# Patient Record
Sex: Male | Born: 1975 | Race: White | Hispanic: No | Smoking: Never smoker
Health system: Southern US, Community
[De-identification: ages and names within clinical notes are randomized; demographics above are authoritative.]

## PROBLEM LIST (undated history)

## (undated) DIAGNOSIS — R51 Headache: Secondary | ICD-10-CM

## (undated) DIAGNOSIS — M1990 Unspecified osteoarthritis, unspecified site: Secondary | ICD-10-CM

## (undated) DIAGNOSIS — R519 Headache, unspecified: Secondary | ICD-10-CM

## (undated) HISTORY — PX: APPENDECTOMY: SHX54

## (undated) HISTORY — PX: KNEE ARTHROPLASTY: SHX992

---

## 2001-02-27 ENCOUNTER — Encounter (INDEPENDENT_AMBULATORY_CARE_PROVIDER_SITE_OTHER): Payer: Self-pay | Admitting: *Deleted

## 2001-02-28 ENCOUNTER — Inpatient Hospital Stay (HOSPITAL_COMMUNITY): Admission: EM | Admit: 2001-02-28 | Discharge: 2001-03-01 | Payer: Self-pay | Admitting: *Deleted

## 2006-09-29 ENCOUNTER — Emergency Department (HOSPITAL_COMMUNITY): Admission: EM | Admit: 2006-09-29 | Discharge: 2006-09-29 | Payer: Self-pay | Admitting: Family Medicine

## 2008-05-10 IMAGING — CR DG KNEE COMPLETE 4+V*L*
5 series · 5 of 5 positions shown · non-contrast
Comparison: none

CLINICAL DATA: Fall.
 LEFT KNEE ? 4 VIEW ? 7776 hours:

[view not recorded (1 of 5)]
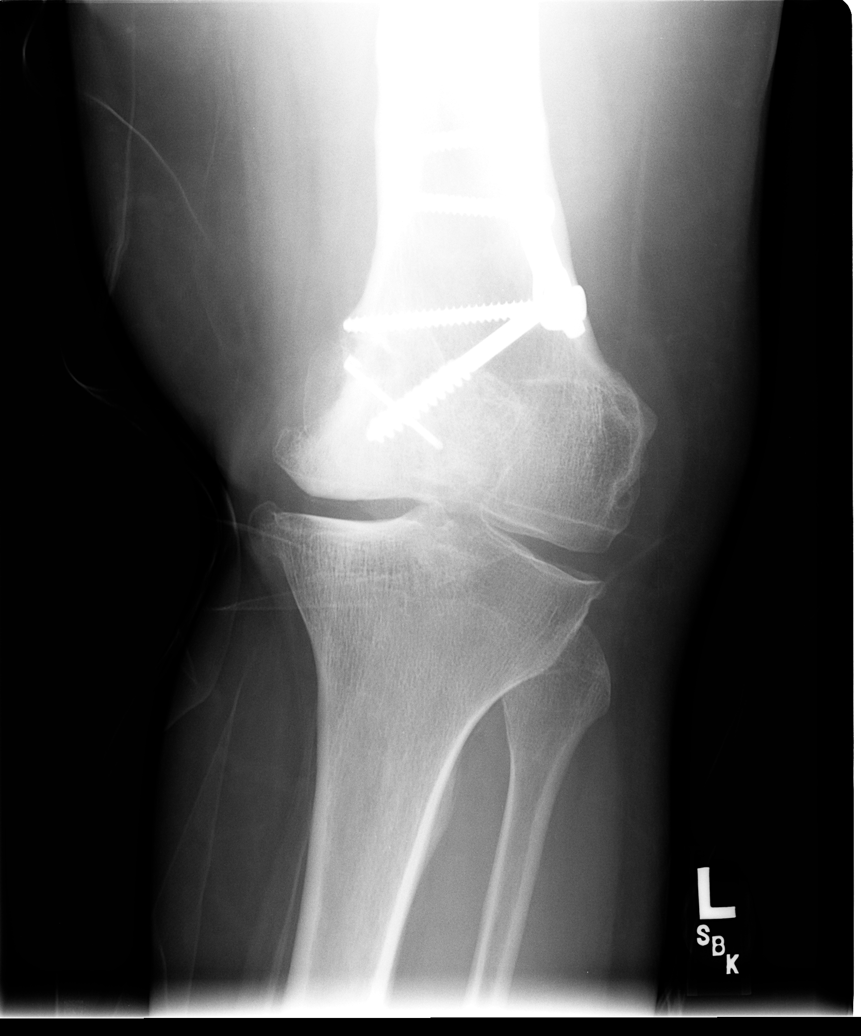

[view not recorded (2 of 5)]
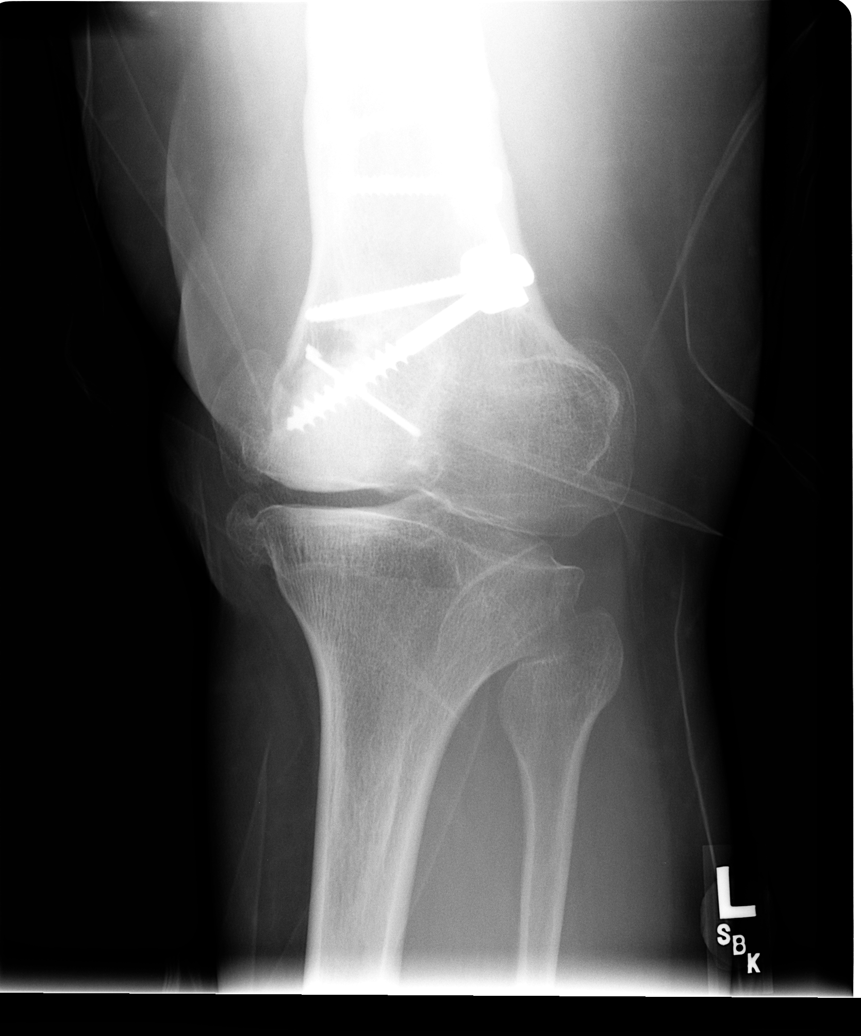

[view not recorded (3 of 5)]
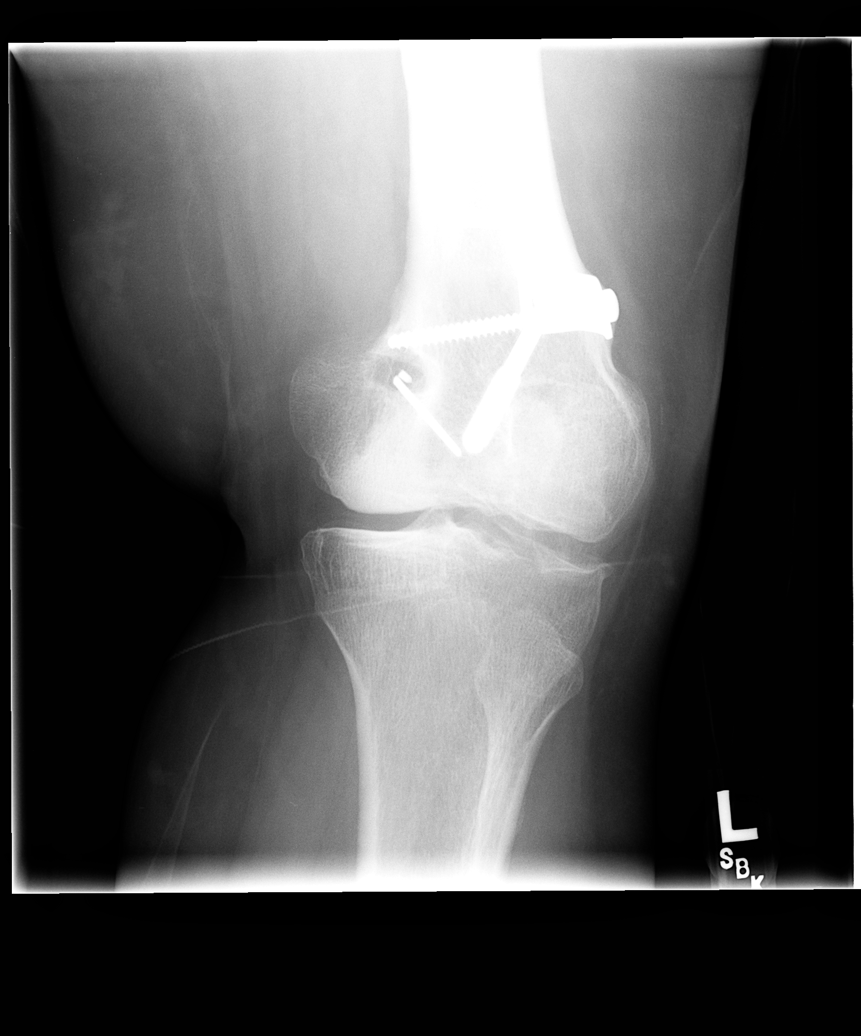

[view not recorded (4 of 5)]
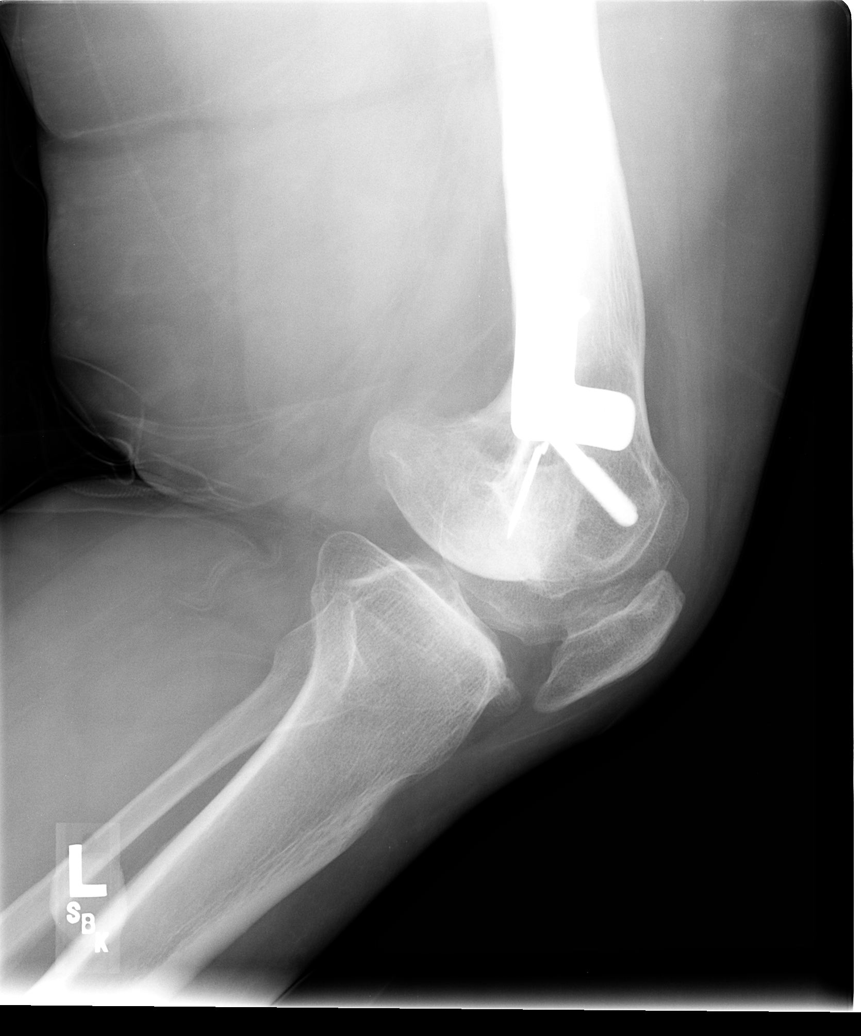

[view not recorded (5 of 5)]
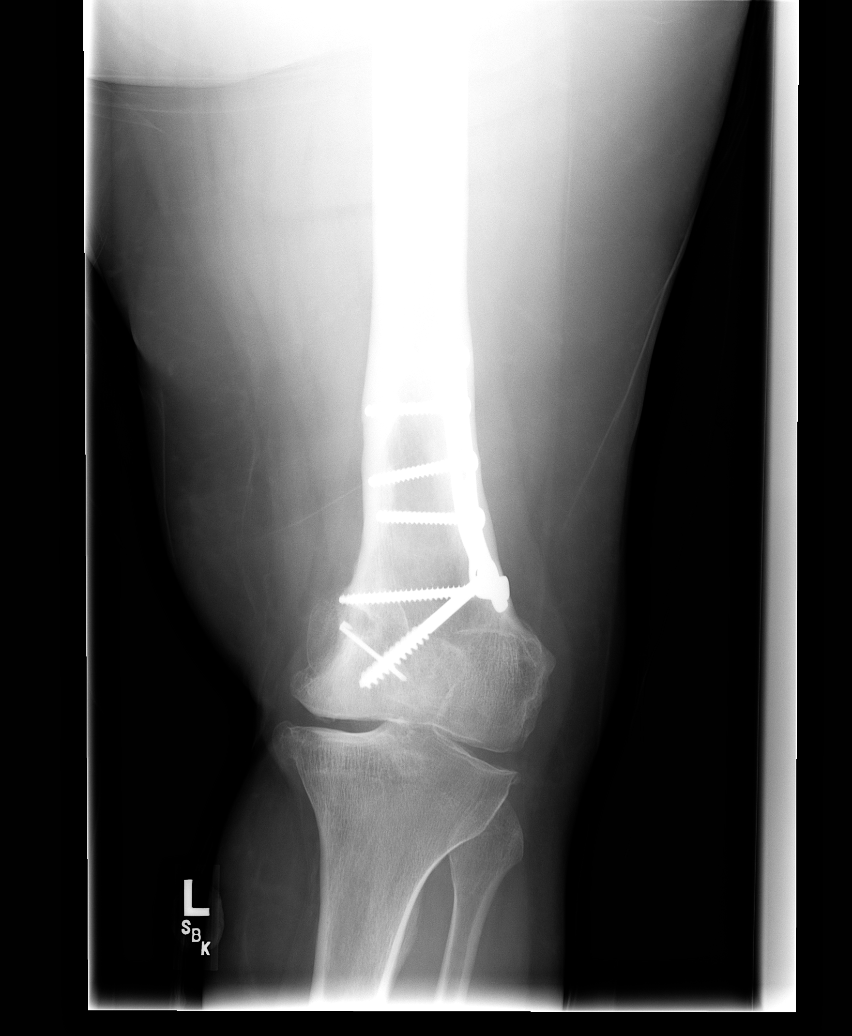

[5 of 5 positions shown; findings below may reference images not displayed]

FINDINGS: Plates and screws are seen within the distal femur.  There is deformity compatible with posttraumatic changes.  Degenerative changes are present most severely affecting the lateral compartment.  No definite acute fractures are seen.  There is a pin within the lateral femoral condyle that is fractured.  Correlate clinically.
IMPRESSION: No acute fracture.  ORIF femur.  There is a fractured pin in the lateral femoral condyle.

## 2010-12-16 NOTE — Discharge Summary (Signed)
[place]. [facility]  Patient:    [name], [name]                     MRN: [id number] Adm. Date:  [id number] Disc. Date: [id number] Attending:  Katha Cabal                           Discharge Summary  PREOPERATIVE DIAGNOSIS:  Acute appendicitis.  DISCHARGE DIAGNOSIS:  Acute appendicitis.  PROCEDURES:  Laparoscopic appendectomy.  HOSPITAL COURSE:  Philip Charles is a 35 year old male who was admitted through the emergency room and taken straight to the operating room.  He underwent a laparoscopic appendectomy.  He began feeling better and was ready for discharge on March 01, 2001.  DISCHARGE MEDICATIONS:  He will be given some ______ to take for pain.  FOLLOW-UP:  He will be followed up in the office in approximately three weeks. DD:  03/01/01 TD:  03/03/01 Job: [id number]

## 2010-12-16 NOTE — Op Note (Signed)
. [facility]  Patient:    Philip Charles, Philip Charles                     MRN: [id number] Proc. Date: 02/28/01 Adm. Date:  [id number] Attending:  Corlis Leak CC:         [facility]   Operative Report  PREOPERATIVE DIAGNOSIS:  Acute appendicitis.  POSTOPERATIVE DIAGNOSIS:  Acute appendicitis.  OPERATION: Laparoscopic appendectomy.  SURGEON:  Thornton Park. Daphine Deutscher, M.D.  ANESTHESIA:  General endotracheal.  DESCRIPTION OF PROCEDURE:  [name] was seen by me in triage tonight, having been referred over by the [facility].  This 35 year old gentleman had a 24 hour history of generalized abdominal pain localized in the right lower quadrant.  Informed consent was obtained regarding laparoscopic as well as open appendectomy when I saw him and his mother.  The patient was taken room 16, given general anesthesia.  Preoperatively, he received 3 grams of Unasyn.  The abdominal was prepped with Betadine and draped sterilely.  An umbilical incision was made longitudinally and through this pursestring suture, Hasson cannula was introduced.  The abdomen was insufflated and then a 10-11 was placed in the left lower quadrant and a 5 mm was placed in the right upper quadrant.  The appendix was easily visualized and was stuck to the ileum.  It was teased away and elevated.  It was quite thickened and suppurative.  I went through the mesentery with an application of the endoGIA.  I isolated the base.  I put some clips on a little bit of the remnant of the mesentery and then with the base isolated, I transected with a single application of the endoGIA.  The appendix was then placed in the bag. The appendiceal area was irrigated. No bleeding was seen. All the irrigant was withdrawn.  The bag was brought out through the umbilicus.  Pursestring suture was tied down.  The abdominal was deflated and the trocars were withdrawn. The left lower quadrant was  placed obliquely and these were all examined as I removed them. The patient seemed to tolerate the procedure well and was taken to the recovery room in satisfactory condition.  He will be admitted for observation.  FINAL DIAGNOSIS:  Acute appendicitis status post laparoscopic appendectomy. DD:  02/28/01 TD:  02/28/01 Job: [id number]

## 2016-06-21 DIAGNOSIS — M1732 Unilateral post-traumatic osteoarthritis, left knee: Secondary | ICD-10-CM | POA: Diagnosis not present

## 2016-09-21 DIAGNOSIS — M1732 Unilateral post-traumatic osteoarthritis, left knee: Secondary | ICD-10-CM | POA: Diagnosis not present

## 2016-10-09 DIAGNOSIS — Z0000 Encounter for general adult medical examination without abnormal findings: Secondary | ICD-10-CM | POA: Diagnosis not present

## 2016-10-09 DIAGNOSIS — E559 Vitamin D deficiency, unspecified: Secondary | ICD-10-CM | POA: Diagnosis not present

## 2016-10-16 DIAGNOSIS — Z0000 Encounter for general adult medical examination without abnormal findings: Secondary | ICD-10-CM | POA: Diagnosis not present

## 2016-10-16 DIAGNOSIS — R7989 Other specified abnormal findings of blood chemistry: Secondary | ICD-10-CM | POA: Diagnosis not present

## 2016-10-16 DIAGNOSIS — E785 Hyperlipidemia, unspecified: Secondary | ICD-10-CM | POA: Diagnosis not present

## 2016-10-18 NOTE — Progress Notes (Signed)
Preop on 3/26.  Surgery on 10/30/16.  Need orders in epic.  Thank You.

## 2016-10-19 ENCOUNTER — Ambulatory Visit: Payer: Self-pay | Admitting: Orthopedic Surgery

## 2016-10-20 ENCOUNTER — Other Ambulatory Visit (HOSPITAL_COMMUNITY): Payer: Self-pay | Admitting: Emergency Medicine

## 2016-10-20 NOTE — Patient Instructions (Addendum)
Philip Charles  10/20/2016   Your procedure is scheduled on: 10-30-16  [facility]  [facility] to 3rd floor to  Short Stay Center at 1040AM.  Call this number if you have problems the morning of surgery [phone]   Remember: ONLY 1 PERSON MAY GO WITH YOU TO SHORT STAY TO GET  READY MORNING OF YOUR SURGERY.  Do not eat food or drink liquids :After Midnight.     Take these medicines the morning of surgery with A SIP OF WATER: fexofenadine(allegra), tylenol as needed                                You may not have any metal on your body including hair pins and              piercings  Do not wear jewelry, make-up, lotions, powders or perfumes, deodorant             Do not wear nail polish.  Do not shave  48 hours prior to surgery.              Men may shave face and neck.   Do not bring valuables to the hospital. [facility] IS NOT             RESPONSIBLE   FOR VALUABLES.  Contacts, dentures or bridgework may not be worn into surgery.  Leave suitcase in the car. After surgery it may be brought to your room.               Please read over the following fact sheets you were given: _____________________________________________________________________             [facility] - Preparing for [facility] Before surgery, you can play an important role.  Because skin is not sterile, your skin needs to be as free of germs as possible.  You can reduce the number of germs on your skin by washing with CHG (chlorahexidine gluconate) soap before surgery.  CHG is an antiseptic cleaner which kills germs and bonds with the skin to continue killing germs even after washing. Please DO NOT use if you have an allergy to CHG or antibacterial soaps.  If your skin becomes reddened/irritated stop using the CHG and inform your nurse when you arrive at Short Stay. Do not shave (including legs and underarms) for at least 48 hours prior to the first CHG  shower.  You may shave your face/neck. Please follow these instructions carefully:  1.  Shower with CHG Soap the night before surgery and the  morning of Surgery.  2.  If you choose to wash your hair, wash your hair first as usual with your  normal  shampoo.  3.  After you shampoo, rinse your hair and body thoroughly to remove the  shampoo.                           4.  Use CHG as you would any other liquid soap.  You can apply chg directly  to the skin and wash                       Gently with a scrungie or clean washcloth.  5.  Apply the CHG Soap to  your body ONLY FROM THE NECK DOWN.   Do not use on face/ open                           Wound or open sores. Avoid contact with eyes, ears mouth and genitals (private parts).                       Wash face,  Genitals (private parts) with your normal soap.             6.  Wash thoroughly, paying special attention to the area where your surgery  will be performed.  7.  Thoroughly rinse your body with warm water from the neck down.  8.  DO NOT shower/wash with your normal soap after using and rinsing off  the CHG Soap.                9.  Pat yourself dry with a clean towel.            10.  Wear clean pajamas.            11.  Place clean sheets on your bed the night of your first shower and do not  sleep with pets. Day of Surgery : Do not apply any lotions/deodorants the morning of surgery.  Please wear clean clothes to the hospital/surgery center.  FAILURE TO FOLLOW THESE INSTRUCTIONS MAY RESULT IN THE CANCELLATION OF YOUR SURGERY PATIENT SIGNATURE_________________________________  NURSE SIGNATURE__________________________________  ________________________________________________________________________   Adam Phenix  An incentive spirometer is a tool that can help keep your lungs clear and active. This tool measures how well you are filling your lungs with each breath. Taking long deep breaths may help reverse or decrease the chance  of developing breathing (pulmonary) problems (especially infection) following:  A long period of time when you are unable to move or be active. BEFORE THE PROCEDURE   If the spirometer includes an indicator to show your best effort, your nurse or respiratory therapist will set it to a desired goal.  If possible, sit up straight or lean slightly forward. Try not to slouch.  Hold the incentive spirometer in an upright position. INSTRUCTIONS FOR USE  1. Sit on the edge of your bed if possible, or sit up as far as you can in bed or on a chair. 2. Hold the incentive spirometer in an upright position. 3. Breathe out normally. 4. Place the mouthpiece in your mouth and seal your lips tightly around it. 5. Breathe in slowly and as deeply as possible, raising the piston or the ball toward the top of the column. 6. Hold your breath for 3-5 seconds or for as long as possible. Allow the piston or ball to fall to the bottom of the column. 7. Remove the mouthpiece from your mouth and breathe out normally. 8. Rest for a few seconds and repeat Steps 1 through 7 at least 10 times every 1-2 hours when you are awake. Take your time and take a few normal breaths between deep breaths. 9. The spirometer may include an indicator to show your best effort. Use the indicator as a goal to work toward during each repetition. 10. After each set of 10 deep breaths, practice coughing to be sure your lungs are clear. If you have an incision (the cut made at the time of surgery), support your incision when coughing by placing a pillow or rolled up towels firmly against  it. Once you are able to get out of bed, walk around indoors and cough well. You may stop using the incentive spirometer when instructed by your caregiver.  RISKS AND COMPLICATIONS  Take your time so you do not get dizzy or light-headed.  If you are in pain, you may need to take or ask for pain medication before doing incentive spirometry. It is harder to  take a deep breath if you are having pain. AFTER USE  Rest and breathe slowly and easily.  It can be helpful to keep track of a log of your progress. Your caregiver can provide you with a simple table to help with this. If you are using the spirometer at home, follow these instructions: [place] IF:   You are having difficultly using the spirometer.  You have trouble using the spirometer as often as instructed.  Your pain medication is not giving enough relief while using the spirometer.  You develop fever of 100.5 F (38.1 C) or higher. SEEK IMMEDIATE MEDICAL CARE IF:   You cough up bloody sputum that had not been present before.  You develop fever of 102 F (38.9 C) or greater.  You develop worsening pain at or near the incision site. MAKE SURE YOU:   Understand these instructions.  Will watch your condition.  Will get help right away if you are not doing well or get worse. Document Released: 11/27/2006 Document Revised: 10/09/2011 Document Reviewed: 01/28/2007 Exit[employer] Patient Information 2014 [employer], [place].   ________________________________________________________________________  WHAT IS A BLOOD TRANSFUSION? Blood Transfusion Information  A transfusion is the replacement of blood or some of its parts. Blood is made up of multiple cells which provide different functions.  Red blood cells carry oxygen and are used for blood loss replacement.  White blood cells fight against infection.  Platelets control bleeding.  Plasma helps clot blood.  Other blood products are available for specialized needs, such as hemophilia or other clotting disorders. BEFORE THE TRANSFUSION  Who gives blood for transfusions?   Healthy volunteers who are fully evaluated to make sure their blood is safe. This is blood bank blood. Transfusion therapy is the safest it has ever been in the practice of medicine. Before blood is taken from a donor, a complete history is taken to  make sure that person has no history of diseases nor engages in risky social behavior (examples are intravenous drug use or sexual activity with multiple partners). The donor's travel history is screened to minimize risk of transmitting infections, such as malaria. The donated blood is tested for signs of infectious diseases, such as HIV and hepatitis. The blood is then tested to be sure it is compatible with you in order to minimize the chance of a transfusion reaction. If you or a relative donates blood, this is often done in anticipation of surgery and is not appropriate for emergency situations. It takes many days to process the donated blood. RISKS AND COMPLICATIONS Although transfusion therapy is very safe and saves many lives, the main dangers of transfusion include:   Getting an infectious disease.  Developing a transfusion reaction. This is an allergic reaction to something in the blood you were given. Every precaution is taken to prevent this. The decision to have a blood transfusion has been considered carefully by your caregiver before blood is given. Blood is not given unless the benefits outweigh the risks. AFTER THE TRANSFUSION  Right after receiving a blood transfusion, you will usually feel much better and more  energetic. This is especially true if your red blood cells have gotten low (anemic). The transfusion raises the level of the red blood cells which carry oxygen, and this usually causes an energy increase.  The nurse administering the transfusion will monitor you carefully for complications. HOME CARE INSTRUCTIONS  No special instructions are needed after a transfusion. You may find your energy is better. Speak with your caregiver about any limitations on activity for underlying diseases you may have. SEEK MEDICAL CARE IF:   Your condition is not improving after your transfusion.  You develop redness or irritation at the intravenous (IV) site. SEEK IMMEDIATE MEDICAL CARE  IF:  Any of the following symptoms occur over the next 12 hours:  Shaking chills.  You have a temperature by mouth above 102 F (38.9 C), not controlled by medicine.  Chest, back, or muscle pain.  People around you feel you are not acting correctly or are confused.  Shortness of breath or difficulty breathing.  Dizziness and fainting.  You get a rash or develop hives.  You have a decrease in urine output.  Your urine turns a dark color or changes to pink, red, or brown. Any of the following symptoms occur over the next 10 days:  You have a temperature by mouth above 102 F (38.9 C), not controlled by medicine.  Shortness of breath.  Weakness after normal activity.  The white part of the eye turns yellow (jaundice).  You have a decrease in the amount of urine or are urinating less often.  Your urine turns a dark color or changes to pink, red, or brown. Document Released: 07/14/2000 Document Revised: 10/09/2011 Document Reviewed: 03/02/2008 [facility] Patient Information 2014 [place], [place].  _______________________________________________________________________

## 2016-10-23 ENCOUNTER — Encounter (HOSPITAL_COMMUNITY)
Admission: RE | Admit: 2016-10-23 | Discharge: 2016-10-23 | Disposition: A | Discharge status: Home / Self Care | Payer: BLUE CROSS/BLUE SHIELD | Source: Ambulatory Visit | Attending: Orthopedic Surgery | Admitting: Orthopedic Surgery

## 2016-10-23 ENCOUNTER — Encounter (INDEPENDENT_AMBULATORY_CARE_PROVIDER_SITE_OTHER): Payer: Self-pay

## 2016-10-23 ENCOUNTER — Encounter (HOSPITAL_COMMUNITY): Payer: Self-pay

## 2016-10-23 DIAGNOSIS — M1732 Unilateral post-traumatic osteoarthritis, left knee: Secondary | ICD-10-CM | POA: Diagnosis not present

## 2016-10-23 DIAGNOSIS — T1490XS Injury, unspecified, sequela: Secondary | ICD-10-CM | POA: Diagnosis not present

## 2016-10-23 DIAGNOSIS — X58XXXS Exposure to other specified factors, sequela: Secondary | ICD-10-CM | POA: Diagnosis not present

## 2016-10-23 DIAGNOSIS — Z01818 Encounter for other preprocedural examination: Secondary | ICD-10-CM | POA: Diagnosis not present

## 2016-10-23 HISTORY — DX: Headache: R51

## 2016-10-23 HISTORY — DX: Unspecified osteoarthritis, unspecified site: M19.90

## 2016-10-23 HISTORY — DX: Headache, unspecified: R51.9

## 2016-10-23 LAB — PROTIME-INR
INR: 1.02
PROTHROMBIN TIME: 13.4 s (ref 11.4–15.2)

## 2016-10-23 LAB — SURGICAL PCR SCREEN
MRSA, PCR: NEGATIVE
STAPHYLOCOCCUS AUREUS: NEGATIVE

## 2016-10-23 LAB — APTT: APTT: 29 s (ref 24–36)

## 2016-10-23 LAB — ABO/RH: ABO/RH(D): O POS

## 2016-10-23 NOTE — Progress Notes (Addendum)
CBCdiff; CMP 10-09-16 on chart Medical clearance 10-16-16 Dr Thea SilversmithMackenzie on chart

## 2016-10-25 ENCOUNTER — Ambulatory Visit: Payer: Self-pay | Admitting: Orthopedic Surgery

## 2016-10-25 NOTE — H&P (Signed)
Philip Charles DOB: 05/01/1976 Single / Language: English / Race: White Male Date of Admission: 10/30/2016 CC:  Left knee pain History of Present Illness  The patient is a 40 year old male who comes in for a preoperative History and Physical. The patient is scheduled for a left total knee arthroplasty (and hardware removal) to be performed by Dr. Frank V. [name], MD at [facility] on 10-30-2016. The patient is a 40 year old male who presented for follow up of their knee. The patient is being followed for their left knee pain and post traumatic arthritis. Symptoms reported include: pain, aching, pain with weightbearing and difficulty ambulating. The patient feels that they are doing poorly. The following medication has been used for pain control: antiinflammatory medication (ibuprofen, prn). Philip Charles has had problems with his left lower extremity since an early age. He had a lawnmower accident as a child. He had a large open soft tissue defect medial and posteromedial aspect of the knee as well as a distal femur fracture. He was treated by Dr. Robert Fitch at [facility]. He ended up with a shorter left lower extremity and wears a large lift on his shoe now of several inches. He is in today because he has a progressively worsening pain in his left knee. He has always had stiffness and very limited motion in the knee. The pain part is getting much worse. 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 It is getting progressively more difficult to do this work. He is only 40, but really is unable to do any activities that he desires. He did not have any episodes of infection, associated with his soft tissue or bony injuries as a child. 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 He has distal femur fracture, which is well healed. He has a plate intact lateral distal femur. His alignment overall in the femur looks good, though. He  has a varus deformity of the knee with worst degeneration medial. He has got a real significant problem here. He has got a very stiff knee due to the trauma and also has that soft tissue defect medially. Fortunately, there was never history of infection after his initial injury. At this point, the only thing that predictably could improve his pain, would be a total knee arthroplasty or knee fusion. Functionally, knee fusion is probably the worst thing he could have at his age. Knee replacement, although it may not get him normal motion, should improve his pain significantly and give him a more stable platform to walk on. He would have to have the hardware removed, but it is a short plate and is distal enough, where it can be addressed through the standard total knee incision. I do believe that we would do not need to do this in two stages and it can be done successfully in a single stage. I told him our main concerns would be postoperative motion and also soft tissue healing with potential for infection. Without a prior history of infection, his risk is definitely lower. His soft tissues are mobile enough medially where we should not run into any soft tissue breakdown postoperatively. We did discuss knee replacement in detail. I told him that it is going to be somewhat more risky than a standard knee replacement, but also should provide him good benefit. We discussed all this in detail. He would like to proceed with knee replacement. We will   not be able to add any length to his leg, but by correcting his angular deformity, should functionally make the leg feel a few millimeters longer. He is ready to proceed. They have been treated conservatively in the past for the above stated problem and despite conservative measures, they continue to have progressive pain and severe functional limitations and dysfunction. They have failed non-operative management including home exercise, medications. It is felt that they  would benefit from undergoing total joint replacement. Risks and benefits of the procedure have been discussed with the patient and they elect to proceed with surgery. There are no active contraindications to surgery such as ongoing infection or rapidly progressive neurological disease.   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 Congestive Heart Failure  Mother, Paternal Grandmother. Diabetes Mellitus  Paternal Grandmother. Drug / Alcohol Addiction  Maternal Grandfather. Heart Disease  Maternal Grandfather. Hypertension  Mother, Paternal Grandmother. Osteoarthritis  Mother. Rheumatoid Arthritis  Maternal Grandmother.  Social History  Children  0 Current drinker  06/21/2016: Currently drinks beer only occasionally per week Current work status  working full time Living situation  live with parents Marital status  divorced No history of drug/alcohol rehab  Not under pain contract  Tobacco / smoke exposure  06/21/2016: yes outdoors only Tobacco use  Never smoker. 06/21/2016  Medication History  Ibuprofen (200MG Tablet, Oral as needed) Active. Multivitamin Adult (Oral) Active. Allegra (Oral) Specific strength unknown - Active.  Past Surgical History Appendectomy    Review of Systems General Present- Fever (recent cold that has resolved). Not Present- Chills, Fatigue, Memory Loss, Night Sweats, Weight Gain and Weight Loss. Skin Not Present- Eczema, Hives, Itching, Lesions and Rash. HEENT Present- Hearing Loss. Not Present- Dentures, Double Vision, Headache, Tinnitus and Visual Loss. Respiratory Present- Cough. Not Present- Allergies, Chronic Cough, Coughing up blood, Shortness of breath at rest and Shortness of breath with exertion. Cardiovascular Not Present- Chest Pain, Difficulty  Breathing Lying Down, Murmur, Palpitations, Racing/skipping heartbeats and Swelling. Gastrointestinal Not Present- Abdominal Pain, Bloody Stool, Constipation, Diarrhea, Difficulty Swallowing, Heartburn, Jaundice, Loss of appetitie, Nausea and Vomiting. Male Genitourinary Not Present- Blood in Urine, Discharge, Flank Pain, Incontinence, Painful Urination, Urgency, Urinary frequency, Urinary Retention, Urinating at Night and Weak urinary stream. Musculoskeletal Present- Back Pain and Joint Pain. Not Present- Joint Swelling, Morning Stiffness, Muscle Pain, Muscle Weakness and Spasms. Neurological Not Present- Blackout spells, Difficulty with balance, Dizziness, Paralysis, Tremor and Weakness. Psychiatric Not Present- Insomnia.  Vitals Weight: 197 lb Height: 66in Weight was reported by patient. Height was reported by patient. Body Surface Area: 1.99 m Body Mass Index: 31.8 kg/m  Pulse: 84 (Regular)  BP: 122/72 (Sitting, Right Arm, Standard)  Physical Exam General Mental Status -Alert and cooperative. Note: appears to have somewhat of a flat affect General Appearance-pleasant, Not in acute distress. Orientation-Oriented X3. Build & Nutrition-Well nourished and Well developed.  Head and Neck Head-normocephalic, atraumatic . Neck Global Assessment - supple, no bruit auscultated on the right, no bruit auscultated on the left.  Eye Vision-Wears corrective lenses. Pupil - Bilateral-Regular and Round. Motion - Bilateral-EOMI.  Chest and Lung Exam Auscultation Breath sounds - clear at anterior chest wall and clear at posterior chest wall. Adventitious sounds - No Adventitious sounds.  Cardiovascular Auscultation Rhythm - Regular rate and rhythm. Heart Sounds - S1 WNL and S2 WNL. Murmurs &   Other Heart Sounds - Auscultation of the heart reveals - No Murmurs.  Abdomen Palpation/Percussion Tenderness - Abdomen is non-tender to palpation. Rigidity (guarding) -  Abdomen is soft. Auscultation Auscultation of the abdomen reveals - Bowel sounds normal.  Male Genitourinary Note: Not done, not pertinent to present illness   Musculoskeletal Note: Well-developed male in no distress. He has a well-healed anterior scar and lateral scar. He has a significant soft tissue defect posteromedial. It is not to any open area. The skin is not terribly scarred down. It is very mobile. It is rather thin medially, but as stated, is not fixed to the underlying tissues. His range of motion is about 5 to 80. The knee is tender diffusely. There is no effusion. There is no instability noted. He is several inches short on the left, compared to the right.  RADIOGRAPHS I reviewed his radiographs from the last visit. 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 He has distal femur fracture, which is well healed. He has a plate intact lateral distal femur. His alignment overall in the femur looks good, though. He has a varus deformity of the knee with worst degeneration medial.   Assessment & Plan Post-traumatic osteoarthritis of left knee (Principal Diagnosis) (M17.32)  Note:Surgical Plans: Left Total Knee Replacement with Hardware Removal  Disposition: Home with parents helping after surgery  PCP: Dr. Brian MacKenzie (Seen the Nurse Practioner for clearance)  IV TXA  Anesthesia Issues: None  Patient was instructed on what medications to stop prior to surgery.  Signed electronically by Rashid Whitenight L Keylah Darwish, III PA-C  

## 2016-10-30 ENCOUNTER — Encounter (HOSPITAL_COMMUNITY)
Admission: RE | Disposition: A | Discharge status: Home Health | Payer: Self-pay | Source: Ambulatory Visit | Attending: Orthopedic Surgery

## 2016-10-30 ENCOUNTER — Inpatient Hospital Stay (HOSPITAL_COMMUNITY): Payer: BLUE CROSS/BLUE SHIELD | Admitting: Registered Nurse

## 2016-10-30 ENCOUNTER — Inpatient Hospital Stay (HOSPITAL_COMMUNITY)
Admission: RE | Admit: 2016-10-30 | Discharge: 2016-11-01 | DRG: 468 | Disposition: A | Discharge status: Home Health | Payer: BLUE CROSS/BLUE SHIELD | Source: Ambulatory Visit | Attending: Orthopedic Surgery | Admitting: Orthopedic Surgery

## 2016-10-30 ENCOUNTER — Encounter (HOSPITAL_COMMUNITY): Payer: Self-pay | Admitting: *Deleted

## 2016-10-30 DIAGNOSIS — M1710 Unilateral primary osteoarthritis, unspecified knee: Secondary | ICD-10-CM | POA: Diagnosis present

## 2016-10-30 DIAGNOSIS — G8918 Other acute postprocedural pain: Secondary | ICD-10-CM | POA: Diagnosis not present

## 2016-10-30 DIAGNOSIS — G8921 Chronic pain due to trauma: Secondary | ICD-10-CM | POA: Diagnosis present

## 2016-10-30 DIAGNOSIS — M179 Osteoarthritis of knee, unspecified: Secondary | ICD-10-CM | POA: Diagnosis present

## 2016-10-30 DIAGNOSIS — M1732 Unilateral post-traumatic osteoarthritis, left knee: Principal | ICD-10-CM | POA: Diagnosis present

## 2016-10-30 DIAGNOSIS — H918X9 Other specified hearing loss, unspecified ear: Secondary | ICD-10-CM | POA: Diagnosis not present

## 2016-10-30 DIAGNOSIS — T8489XA Other specified complication of internal orthopedic prosthetic devices, implants and grafts, initial encounter: Secondary | ICD-10-CM | POA: Diagnosis not present

## 2016-10-30 DIAGNOSIS — Z472 Encounter for removal of internal fixation device: Secondary | ICD-10-CM | POA: Diagnosis not present

## 2016-10-30 HISTORY — PX: HARDWARE REMOVAL: SHX979

## 2016-10-30 HISTORY — PX: TOTAL KNEE ARTHROPLASTY: SHX125

## 2016-10-30 LAB — CBC
HCT: 38.9 % — ABNORMAL LOW (ref 39.0–52.0)
Hemoglobin: 13.1 g/dL (ref 13.0–17.0)
MCH: 28.6 pg (ref 26.0–34.0)
MCHC: 33.7 g/dL (ref 30.0–36.0)
MCV: 84.9 fL (ref 78.0–100.0)
Platelets: 275 10*3/uL (ref 150–400)
RBC: 4.58 MIL/uL (ref 4.22–5.81)
RDW: 13.3 % (ref 11.5–15.5)
WBC: 10.5 10*3/uL (ref 4.0–10.5)

## 2016-10-30 LAB — COMPREHENSIVE METABOLIC PANEL
ALT: 32 U/L (ref 17–63)
AST: 26 U/L (ref 15–41)
Albumin: 3.8 g/dL (ref 3.5–5.0)
Alkaline Phosphatase: 83 U/L (ref 38–126)
Anion gap: 8 (ref 5–15)
BUN: 17 mg/dL (ref 6–20)
CO2: 25 mmol/L (ref 22–32)
Calcium: 8.8 mg/dL — ABNORMAL LOW (ref 8.9–10.3)
Chloride: 102 mmol/L (ref 101–111)
Creatinine, Ser: 0.73 mg/dL (ref 0.61–1.24)
GFR calc Af Amer: >60 mL/min (ref >60)
Glucose, Bld: 137 mg/dL — ABNORMAL HIGH (ref 65–99)
POTASSIUM: 3.9 mmol/L (ref 3.5–5.1)
SODIUM: 135 mmol/L (ref 135–145)
Total Bilirubin: 0.6 mg/dL (ref 0.3–1.2)
Total Protein: 6.9 g/dL (ref 6.5–8.1)

## 2016-10-30 LAB — TYPE AND SCREEN
ABO/RH(D): O POS
ANTIBODY SCREEN: NEGATIVE

## 2016-10-30 SURGERY — ARTHROPLASTY, KNEE, TOTAL
Anesthesia: General | Site: Knee | Laterality: Left

## 2016-10-30 MED ORDER — FLEET ENEMA 7-19 GM/118ML RE ENEM: 1.0000 | ENEMA | Freq: Once | RECTAL | Status: DC | PRN

## 2016-10-30 MED ORDER — FENTANYL CITRATE (PF) 100 MCG/2ML IJ SOLN
50.0000 ug | INTRAMUSCULAR | Status: DC | PRN
  Administered 2016-10-30: 100 ug via INTRAVENOUS

## 2016-10-30 MED ORDER — SODIUM CHLORIDE 0.9 % IV SOLN
INTRAVENOUS | Status: DC
  Administered 2016-10-30 – 2016-10-31 (×2): via INTRAVENOUS

## 2016-10-30 MED ORDER — BISACODYL 10 MG RE SUPP: 10.0000 mg | Freq: Every day | RECTAL | Status: DC | PRN

## 2016-10-30 MED ORDER — PROPOFOL 10 MG/ML IV BOLUS
INTRAVENOUS | Status: DC | PRN
  Administered 2016-10-30: 180 mg via INTRAVENOUS
  Administered 2016-10-30: 50 mg via INTRAVENOUS

## 2016-10-30 MED ORDER — SODIUM CHLORIDE 0.9 % IJ SOLN
INTRAMUSCULAR | Status: AC
  Filled 2016-10-30: qty 50

## 2016-10-30 MED ORDER — TRANEXAMIC ACID 1000 MG/10ML IV SOLN
1000.0000 mg | INTRAVENOUS | Status: AC
  Administered 2016-10-30: 1000 mg via INTRAVENOUS
  Filled 2016-10-30: qty 1100

## 2016-10-30 MED ORDER — DIPHENHYDRAMINE HCL 12.5 MG/5ML PO ELIX: 12.5000 mg | ORAL_SOLUTION | ORAL | Status: DC | PRN

## 2016-10-30 MED ORDER — METOCLOPRAMIDE HCL 5 MG PO TABS: 5.0000 mg | ORAL_TABLET | Freq: Three times a day (TID) | ORAL | Status: DC | PRN

## 2016-10-30 MED ORDER — MORPHINE SULFATE (PF) 2 MG/ML IV SOLN
1.0000 mg | INTRAVENOUS | Status: DC | PRN
  Administered 2016-10-30 (×2): 1 mg via INTRAVENOUS
  Filled 2016-10-30 (×2): qty 1

## 2016-10-30 MED ORDER — BUPIVACAINE LIPOSOME 1.3 % IJ SUSP
20.0000 mL | Freq: Once | INTRAMUSCULAR | Status: DC
  Filled 2016-10-30 (×2): qty 20

## 2016-10-30 MED ORDER — ACETAMINOPHEN 500 MG PO TABS
1000.0000 mg | ORAL_TABLET | Freq: Four times a day (QID) | ORAL | Status: AC
  Administered 2016-10-30 – 2016-10-31 (×4): 1000 mg via ORAL
  Filled 2016-10-30 (×5): qty 2

## 2016-10-30 MED ORDER — DEXAMETHASONE SODIUM PHOSPHATE 10 MG/ML IJ SOLN
INTRAMUSCULAR | Status: AC
  Filled 2016-10-30: qty 1

## 2016-10-30 MED ORDER — ONDANSETRON HCL 4 MG/2ML IJ SOLN
INTRAMUSCULAR | Status: AC
  Filled 2016-10-30: qty 2

## 2016-10-30 MED ORDER — 0.9 % SODIUM CHLORIDE (POUR BTL) OPTIME
TOPICAL | Status: DC | PRN
  Administered 2016-10-30: 1000 mL

## 2016-10-30 MED ORDER — BUPIVACAINE HCL (PF) 0.25 % IJ SOLN
INTRAMUSCULAR | Status: AC
  Filled 2016-10-30: qty 30

## 2016-10-30 MED ORDER — POLYETHYLENE GLYCOL 3350 17 G PO PACK: 17.0000 g | PACK | Freq: Every day | ORAL | Status: DC | PRN

## 2016-10-30 MED ORDER — FENTANYL CITRATE (PF) 250 MCG/5ML IJ SOLN
INTRAMUSCULAR | Status: AC
  Filled 2016-10-30: qty 5

## 2016-10-30 MED ORDER — LORATADINE 10 MG PO TABS
10.0000 mg | ORAL_TABLET | Freq: Every day | ORAL | Status: DC
  Administered 2016-10-31 – 2016-11-01 (×2): 10 mg via ORAL
  Filled 2016-10-30 (×2): qty 1

## 2016-10-30 MED ORDER — ACETAMINOPHEN 10 MG/ML IV SOLN
INTRAVENOUS | Status: AC
  Filled 2016-10-30: qty 100

## 2016-10-30 MED ORDER — MIDAZOLAM HCL 2 MG/2ML IJ SOLN
INTRAMUSCULAR | Status: AC
  Administered 2016-10-30: 2 mg
  Filled 2016-10-30: qty 2

## 2016-10-30 MED ORDER — OXYCODONE HCL 5 MG PO TABS
5.0000 mg | ORAL_TABLET | ORAL | Status: DC | PRN
  Administered 2016-10-30: 5 mg via ORAL
  Administered 2016-10-31 – 2016-11-01 (×7): 10 mg via ORAL
  Filled 2016-10-30: qty 2
  Filled 2016-10-30: qty 1
  Filled 2016-10-30 (×7): qty 2

## 2016-10-30 MED ORDER — HYDROMORPHONE HCL 1 MG/ML IJ SOLN: 0.2500 mg | INTRAMUSCULAR | Status: DC | PRN

## 2016-10-30 MED ORDER — FENTANYL CITRATE (PF) 100 MCG/2ML IJ SOLN
INTRAMUSCULAR | Status: AC
  Administered 2016-10-30: 100 ug via INTRAVENOUS
  Filled 2016-10-30: qty 2

## 2016-10-30 MED ORDER — SODIUM CHLORIDE 0.9 % IR SOLN
Status: DC | PRN
  Administered 2016-10-30: 1000 mL

## 2016-10-30 MED ORDER — ONDANSETRON HCL 4 MG/2ML IJ SOLN
INTRAMUSCULAR | Status: DC | PRN
  Administered 2016-10-30: 4 mg via INTRAVENOUS

## 2016-10-30 MED ORDER — ACETAMINOPHEN 325 MG PO TABS
650.0000 mg | ORAL_TABLET | Freq: Four times a day (QID) | ORAL | Status: DC | PRN
  Administered 2016-11-01: 650 mg via ORAL
  Filled 2016-10-30: qty 2

## 2016-10-30 MED ORDER — RIVAROXABAN 10 MG PO TABS
10.0000 mg | ORAL_TABLET | Freq: Every day | ORAL | Status: DC
  Administered 2016-10-31 – 2016-11-01 (×2): 10 mg via ORAL
  Filled 2016-10-30 (×2): qty 1

## 2016-10-30 MED ORDER — DEXAMETHASONE SODIUM PHOSPHATE 10 MG/ML IJ SOLN
10.0000 mg | Freq: Once | INTRAMUSCULAR | Status: AC
  Administered 2016-10-31: 10 mg via INTRAVENOUS
  Filled 2016-10-30: qty 1

## 2016-10-30 MED ORDER — GABAPENTIN 600 MG PO TABS: 300.0000 mg | ORAL_TABLET | Freq: Once | ORAL | Status: DC

## 2016-10-30 MED ORDER — METHOCARBAMOL 500 MG PO TABS
500.0000 mg | ORAL_TABLET | Freq: Four times a day (QID) | ORAL | Status: DC | PRN
  Administered 2016-10-31 – 2016-11-01 (×3): 500 mg via ORAL
  Filled 2016-10-30 (×4): qty 1

## 2016-10-30 MED ORDER — OXYCODONE HCL 5 MG PO TABS: 5.0000 mg | ORAL_TABLET | Freq: Once | ORAL | Status: DC | PRN

## 2016-10-30 MED ORDER — DOCUSATE SODIUM 100 MG PO CAPS
100.0000 mg | ORAL_CAPSULE | Freq: Two times a day (BID) | ORAL | Status: DC
  Administered 2016-10-30 – 2016-11-01 (×4): 100 mg via ORAL
  Filled 2016-10-30 (×5): qty 1

## 2016-10-30 MED ORDER — PHENOL 1.4 % MT LIQD
1.0000 | OROMUCOSAL | Status: DC | PRN
  Filled 2016-10-30: qty 177

## 2016-10-30 MED ORDER — BUPIVACAINE LIPOSOME 1.3 % IJ SUSP
INTRAMUSCULAR | Status: DC | PRN
Start: 2016-10-30 — End: 2016-10-30
  Administered 2016-10-30: 20 mL

## 2016-10-30 MED ORDER — MENTHOL 3 MG MT LOZG: 1.0000 | LOZENGE | OROMUCOSAL | Status: DC | PRN

## 2016-10-30 MED ORDER — ONDANSETRON HCL 4 MG/2ML IJ SOLN: 4.0000 mg | Freq: Four times a day (QID) | INTRAMUSCULAR | Status: DC | PRN

## 2016-10-30 MED ORDER — PROPOFOL 10 MG/ML IV BOLUS
INTRAVENOUS | Status: AC
  Filled 2016-10-30: qty 60

## 2016-10-30 MED ORDER — METHOCARBAMOL 1000 MG/10ML IJ SOLN
500.0000 mg | Freq: Four times a day (QID) | INTRAVENOUS | Status: DC | PRN
  Administered 2016-10-30: 500 mg via INTRAVENOUS
  Filled 2016-10-30: qty 550
  Filled 2016-10-30: qty 5

## 2016-10-30 MED ORDER — TRAMADOL HCL 50 MG PO TABS
50.0000 mg | ORAL_TABLET | Freq: Four times a day (QID) | ORAL | Status: DC | PRN
  Administered 2016-10-31: 100 mg via ORAL
  Filled 2016-10-30: qty 2

## 2016-10-30 MED ORDER — CEFAZOLIN IN D5W 1 GM/50ML IV SOLN
1.0000 g | Freq: Four times a day (QID) | INTRAVENOUS | Status: AC
  Administered 2016-10-30 – 2016-10-31 (×2): 1 g via INTRAVENOUS
  Filled 2016-10-30 (×2): qty 50

## 2016-10-30 MED ORDER — CHLORHEXIDINE GLUCONATE 4 % EX LIQD: 60.0000 mL | Freq: Once | CUTANEOUS | Status: DC

## 2016-10-30 MED ORDER — OXYCODONE HCL 5 MG/5ML PO SOLN: 5.0000 mg | Freq: Once | ORAL | Status: DC | PRN

## 2016-10-30 MED ORDER — FENTANYL CITRATE (PF) 100 MCG/2ML IJ SOLN
INTRAMUSCULAR | Status: DC | PRN
  Administered 2016-10-30 (×4): 50 ug via INTRAVENOUS
  Administered 2016-10-30: 100 ug via INTRAVENOUS
  Administered 2016-10-30 (×3): 50 ug via INTRAVENOUS

## 2016-10-30 MED ORDER — GABAPENTIN 300 MG PO CAPS
300.0000 mg | ORAL_CAPSULE | Freq: Once | ORAL | Status: AC
  Administered 2016-10-30: 300 mg via ORAL

## 2016-10-30 MED ORDER — GABAPENTIN 300 MG PO CAPS
300.0000 mg | ORAL_CAPSULE | Freq: Three times a day (TID) | ORAL | Status: DC
  Administered 2016-10-30 – 2016-11-01 (×5): 300 mg via ORAL
  Filled 2016-10-30 (×5): qty 1

## 2016-10-30 MED ORDER — BUPIVACAINE-EPINEPHRINE (PF) 0.5% -1:200000 IJ SOLN
INTRAMUSCULAR | Status: DC | PRN
  Administered 2016-10-30: 20 mL via PERINEURAL

## 2016-10-30 MED ORDER — CEFAZOLIN SODIUM-DEXTROSE 2-4 GM/100ML-% IV SOLN
INTRAVENOUS | Status: AC
  Filled 2016-10-30: qty 100

## 2016-10-30 MED ORDER — METOCLOPRAMIDE HCL 5 MG/ML IJ SOLN: 5.0000 mg | Freq: Three times a day (TID) | INTRAMUSCULAR | Status: DC | PRN

## 2016-10-30 MED ORDER — ACETAMINOPHEN 10 MG/ML IV SOLN
1000.0000 mg | Freq: Once | INTRAVENOUS | Status: AC
  Administered 2016-10-30: 1000 mg via INTRAVENOUS

## 2016-10-30 MED ORDER — ACETAMINOPHEN 650 MG RE SUPP: 650.0000 mg | Freq: Four times a day (QID) | RECTAL | Status: DC | PRN

## 2016-10-30 MED ORDER — GABAPENTIN 300 MG PO CAPS
ORAL_CAPSULE | ORAL | Status: AC
  Administered 2016-10-30: 300 mg via ORAL
  Filled 2016-10-30: qty 1

## 2016-10-30 MED ORDER — MIDAZOLAM HCL 5 MG/ML IJ SOLN
1.0000 mg | INTRAMUSCULAR | Status: DC | PRN
Start: 2016-10-30 — End: 2016-10-30

## 2016-10-30 MED ORDER — DEXAMETHASONE SODIUM PHOSPHATE 10 MG/ML IJ SOLN
10.0000 mg | Freq: Once | INTRAMUSCULAR | Status: AC
  Administered 2016-10-30: 10 mg via INTRAVENOUS

## 2016-10-30 MED ORDER — CEFAZOLIN SODIUM-DEXTROSE 2-4 GM/100ML-% IV SOLN
2.0000 g | INTRAVENOUS | Status: AC
  Administered 2016-10-30: 2 g via INTRAVENOUS

## 2016-10-30 MED ORDER — SODIUM CHLORIDE 0.9 % IJ SOLN
INTRAMUSCULAR | Status: DC | PRN
  Administered 2016-10-30: 30 mL

## 2016-10-30 MED ORDER — HYDROMORPHONE HCL 2 MG/ML IJ SOLN
INTRAMUSCULAR | Status: AC
  Filled 2016-10-30: qty 1

## 2016-10-30 MED ORDER — HYDROMORPHONE HCL 1 MG/ML IJ SOLN
INTRAMUSCULAR | Status: DC | PRN
  Administered 2016-10-30 (×2): 1 mg via INTRAVENOUS

## 2016-10-30 MED ORDER — TRANEXAMIC ACID 1000 MG/10ML IV SOLN
1000.0000 mg | Freq: Once | INTRAVENOUS | Status: AC
  Administered 2016-10-30: 1000 mg via INTRAVENOUS
  Filled 2016-10-30: qty 1100

## 2016-10-30 MED ORDER — LACTATED RINGERS IV SOLN
INTRAVENOUS | Status: DC
  Administered 2016-10-30 (×3): via INTRAVENOUS

## 2016-10-30 MED ORDER — ONDANSETRON HCL 4 MG PO TABS: 4.0000 mg | ORAL_TABLET | Freq: Four times a day (QID) | ORAL | Status: DC | PRN

## 2016-10-30 SURGICAL SUPPLY — 70 items
AUG FEM SZ4 4 STRL LF KN LT TI (Knees) ×1 IMPLANT
AUGMENT FEM SZ4 4 LT DIST PFC (Knees) ×1 IMPLANT
BAG DECANTER FOR FLEXI CONT (MISCELLANEOUS) IMPLANT
BAG SPEC THK2 15X12 ZIP CLS (MISCELLANEOUS) ×1
BAG ZIPLOCK 12X15 (MISCELLANEOUS) ×2 IMPLANT
BANDAGE ACE 6X5 VEL STRL LF (GAUZE/BANDAGES/DRESSINGS) ×2 IMPLANT
BANDAGE ESMARK 6X9 LF (GAUZE/BANDAGES/DRESSINGS) IMPLANT
BLADE 10 SAFETY STRL DISP (BLADE) ×2 IMPLANT
BLADE SAG 18X100X1.27 (BLADE) ×2 IMPLANT
BLADE SAW SGTL 11.0X1.19X90.0M (BLADE) ×2 IMPLANT
BNDG CMPR 9X6 STRL LF SNTH (GAUZE/BANDAGES/DRESSINGS)
BNDG ESMARK 6X9 LF (GAUZE/BANDAGES/DRESSINGS)
BOWL SMART MIX CTS (DISPOSABLE) ×2 IMPLANT
BUR OVAL CARBIDE 4.0 (BURR) ×2 IMPLANT
CAP KNEE TOTAL 3 SIGMA ×2 IMPLANT
CEMENT HV SMART SET (Cement) ×4 IMPLANT
CUFF TOURN SGL QUICK 18 (TOURNIQUET CUFF) IMPLANT
CUFF TOURN SGL QUICK 34 (TOURNIQUET CUFF) ×2
CUFF TRNQT CYL 34X4X40X1 (TOURNIQUET CUFF) ×1 IMPLANT
DECANTER SPIKE VIAL GLASS SM (MISCELLANEOUS) ×2 IMPLANT
DRAPE C-ARM 42X120 X-RAY (DRAPES) IMPLANT
DRAPE C-ARMOR (DRAPES) IMPLANT
DRAPE EXTREMITY T 121X128X90 (DRAPE) IMPLANT
DRAPE INCISE IOBAN 66X45 STRL (DRAPES) IMPLANT
DRAPE ORTHO SPLIT 77X108 STRL (DRAPES)
DRAPE SURG ORHT 6 SPLT 77X108 (DRAPES) IMPLANT
DRAPE U-SHAPE 47X51 STRL (DRAPES) ×2 IMPLANT
DRSG ADAPTIC 3X8 NADH LF (GAUZE/BANDAGES/DRESSINGS) ×2 IMPLANT
DRSG PAD ABDOMINAL 8X10 ST (GAUZE/BANDAGES/DRESSINGS) ×2 IMPLANT
DURAPREP 26ML APPLICATOR (WOUND CARE) ×2 IMPLANT
ELECT REM PT RETURN 15FT ADLT (MISCELLANEOUS) ×2 IMPLANT
EVACUATOR 1/8 PVC DRAIN (DRAIN) ×2 IMPLANT
GAUZE SPONGE 4X4 12PLY STRL (GAUZE/BANDAGES/DRESSINGS) ×2 IMPLANT
GLOVE BIO SURGEON STRL SZ7.5 (GLOVE) IMPLANT
GLOVE BIO SURGEON STRL SZ8 (GLOVE) ×2 IMPLANT
GLOVE BIOGEL PI IND STRL 6.5 (GLOVE) IMPLANT
GLOVE BIOGEL PI IND STRL 8 (GLOVE) ×3 IMPLANT
GLOVE BIOGEL PI INDICATOR 6.5 (GLOVE)
GLOVE BIOGEL PI INDICATOR 8 (GLOVE) ×2
GLOVE SURG ORTHO 8.0 STRL STRW (GLOVE) ×1 IMPLANT
GLOVE SURG SS PI 6.5 STRL IVOR (GLOVE) IMPLANT
GOWN STRL REUS W/TWL LRG LVL3 (GOWN DISPOSABLE) ×2 IMPLANT
GOWN STRL REUS W/TWL XL LVL3 (GOWN DISPOSABLE) ×2 IMPLANT
HANDPIECE INTERPULSE COAX TIP (DISPOSABLE) ×2
IMMOBILIZER KNEE 20 (SOFTGOODS) ×2
IMMOBILIZER KNEE 20 THIGH 36 (SOFTGOODS) ×1 IMPLANT
KIT BASIN OR (CUSTOM PROCEDURE TRAY) IMPLANT
MANIFOLD NEPTUNE II (INSTRUMENTS) ×2 IMPLANT
NS IRRIG 1000ML POUR BTL (IV SOLUTION) ×2 IMPLANT
PACK TOTAL JOINT (CUSTOM PROCEDURE TRAY) ×2 IMPLANT
PACK TOTAL KNEE CUSTOM (KITS) ×2 IMPLANT
PADDING CAST COTTON 6X4 STRL (CAST SUPPLIES) ×4 IMPLANT
POSITIONER SURGICAL ARM (MISCELLANEOUS) ×2 IMPLANT
SET HNDPC FAN SPRY TIP SCT (DISPOSABLE) ×1 IMPLANT
STAPLER VISISTAT 35W (STAPLE) ×1 IMPLANT
STRIP CLOSURE SKIN 1/2X4 (GAUZE/BANDAGES/DRESSINGS) ×4 IMPLANT
SUT MNCRL AB 4-0 PS2 18 (SUTURE) ×2 IMPLANT
SUT STRATAFIX 0 PDS 27 VIOLET (SUTURE) ×2
SUT VIC AB 0 CT1 36 (SUTURE) ×4 IMPLANT
SUT VIC AB 2-0 CT1 27 (SUTURE) ×6
SUT VIC AB 2-0 CT1 TAPERPNT 27 (SUTURE) ×3 IMPLANT
SUTURE STRATFX 0 PDS 27 VIOLET (SUTURE) ×1 IMPLANT
SYR 50ML LL SCALE MARK (SYRINGE) IMPLANT
TOWEL OR 17X26 10 PK STRL BLUE (TOWEL DISPOSABLE) IMPLANT
TRAY FOLEY W/METER SILVER 16FR (SET/KITS/TRAYS/PACK) ×2 IMPLANT
UNDERPAD 30X30 INCONTINENT (UNDERPADS AND DIAPERS) IMPLANT
WATER STERILE IRR 1000ML POUR (IV SOLUTION) ×4 IMPLANT
WATER STERILE IRR 1500ML POUR (IV SOLUTION) ×4 IMPLANT
WRAP KNEE MAXI GEL POST OP (GAUZE/BANDAGES/DRESSINGS) ×2 IMPLANT
YANKAUER SUCT BULB TIP 10FT TU (MISCELLANEOUS) ×2 IMPLANT

## 2016-10-30 NOTE — Progress Notes (Signed)
Assisted Dr. Hodierne with left, ultrasound guided, adductor canal block. Side rails up, monitors on throughout procedure. See vital signs in flow sheet. Tolerated Procedure well.  

## 2016-10-30 NOTE — Brief Op Note (Signed)
10/30/2016  3:19 PM  PATIENT:  Shanda Bumps  41 y.o. male  PRE-OPERATIVE DIAGNOSIS:  left knee post-traumatic OA  POST-OPERATIVE DIAGNOSIS:  left knee post-traumatic OA  PROCEDURE:  Procedure(s) with comments: LEFT TOTAL KNEE ARTHROPLASTY AND HARDWARE REMOVAL (Left) - with abductor block HARDWARE REMOVAL (Left)  SURGEON:  Surgeon(s) and Role:    * Ollen Gross, MD - Primary  PHYSICIAN ASSISTANT:   ASSISTANTS: Leilani Able, PA-C   ANESTHESIA:   Adductor canal block and general  EBL:  Total I/O In: 2000 [I.V.:2000] Out: 365 [Urine:165; Blood:200]  BLOOD ADMINISTERED:none  DRAINS: (Medium) Hemovact drain(s) in the left knee with  Suction Open   LOCAL MEDICATIONS USED:  OTHER Exparel  COUNTS:  YES  TOURNIQUET:   Total Tourniquet Time Documented: Thigh (Left) - 114 minutes Total: Thigh (Left) - 114 minutes   DICTATION: .Other Dictation: Dictation Number [id number]  PLAN OF CARE: Admit to inpatient   PATIENT DISPOSITION:  PACU - hemodynamically stable.

## 2016-10-30 NOTE — Anesthesia Preprocedure Evaluation (Signed)
Anesthesia Evaluation  Patient identified by MRN, date of birth, ID band Patient awake    Reviewed: Allergy & Precautions, H&P , NPO status , Patient's Chart, lab work & pertinent test results  Airway Mallampati: II   Neck ROM: full    Dental   Pulmonary neg pulmonary ROS,    breath sounds clear to auscultation       Cardiovascular negative cardio ROS   Rhythm:regular Rate:Normal     Neuro/Psych  Headaches,    GI/Hepatic   Endo/Other    Renal/GU      Musculoskeletal  (+) Arthritis , Osteoarthritis,    Abdominal   Peds  Hematology   Anesthesia Other Findings   Reproductive/Obstetrics                             Anesthesia Physical Anesthesia Plan  ASA: II  Anesthesia Plan: Spinal   Post-op Pain Management:  Regional for Post-op pain   Induction: Intravenous  Airway Management Planned: Simple Face Mask  Additional Equipment:   Intra-op Plan:   Post-operative Plan:   Informed Consent: I have reviewed the patients History and Physical, chart, labs and discussed the procedure including the risks, benefits and alternatives for the proposed anesthesia with the patient or authorized representative who has indicated his/her understanding and acceptance.     Plan Discussed with: CRNA, Anesthesiologist and Surgeon  Anesthesia Plan Comments:         Anesthesia Quick Evaluation

## 2016-10-30 NOTE — Interval H&P Note (Signed)
History and Physical Interval Note:  10/30/2016 11:50 AM  Philip Charles  has presented today for surgery, with the diagnosis of left knee post-traumatic OA  The various methods of treatment have been discussed with the patient and family. After consideration of risks, benefits and other options for treatment, the patient has consented to  Procedure(s): LEFT TOTAL KNEE ARTHROPLASTY AND HARDWARE REMOVAL (Left) HARDWARE REMOVAL (Left) as a surgical intervention .  The patient's history has been reviewed, patient examined, no change in status, stable for surgery.  I have reviewed the patient's chart and labs.  Questions were answered to the patient's satisfaction.     Loanne Drilling

## 2016-10-30 NOTE — Anesthesia Procedure Notes (Signed)
Procedure Name: LMA Insertion Date/Time: 10/30/2016 12:47 PM Performed by: Anastasio Champion E Pre-anesthesia Checklist: Patient identified, Emergency Drugs available, Suction available and Patient being monitored Patient Re-evaluated:Patient Re-evaluated prior to inductionOxygen Delivery Method: Circle system utilized Preoxygenation: Pre-oxygenation with 100% oxygen Intubation Type: IV induction Ventilation: Mask ventilation without difficulty LMA: LMA with gastric port inserted LMA Size: 5.0 Tube type: Oral Number of attempts: 1 Airway Equipment and Method: Oral airway Placement Confirmation: positive ETCO2 Tube secured with: Tape Dental Injury: Teeth and Oropharynx as per pre-operative assessment

## 2016-10-30 NOTE — Anesthesia Procedure Notes (Signed)
Anesthesia Regional Block: Adductor canal block   Pre-Anesthetic Checklist: ,, timeout performed, Correct Patient, Correct Site, Correct Laterality, Correct Procedure, Correct Position, site marked, Risks and benefits discussed,  Surgical consent,  Pre-op evaluation,  At surgeon's request and post-op pain management  Laterality: Left  Prep: chloraprep       Needles:  Injection technique: Single-shot  Needle Type: Echogenic Needle     Needle Length: 9cm  Needle Gauge: 21     Additional Needles:   Procedures: ultrasound guided,,,,,,,,  Narrative:  Start time: 10/30/2016 12:30 PM End time: 10/30/2016 12:39 PM Injection made incrementally with aspirations every 5 mL.  Performed by: Personally  Anesthesiologist: Rochanda Harpham  Additional Notes: Pt tolerated the procedure well.

## 2016-10-30 NOTE — Transfer of Care (Signed)
Immediate Anesthesia Transfer of Care Note  Patient: Philip Charles  Procedure(s) Performed: Procedure(s) with comments: LEFT TOTAL KNEE ARTHROPLASTY AND HARDWARE REMOVAL (Left) - with abductor block HARDWARE REMOVAL (Left)  Patient Location: PACU  Anesthesia Type:General  Level of Consciousness: awake, alert , oriented and patient cooperative  Airway & Oxygen Therapy: Patient Spontanous Breathing and Patient connected to face mask oxygen  Post-op Assessment: Report given to RN and Post -op Vital signs reviewed and stable  Post vital signs: stable  Last Vitals:  Vitals:   10/30/16 1232 10/30/16 1234  BP: 125/83   Pulse: 64 67  Resp: 18 15  Temp:      Last Pain:  Vitals:   10/30/16 1146  TempSrc:   PainSc: 5          Complications: No apparent anesthesia complications

## 2016-10-30 NOTE — Anesthesia Postprocedure Evaluation (Addendum)
Anesthesia Post Note  Patient: Philip Charles  Procedure(s) Performed: Procedure(s) (LRB): LEFT TOTAL KNEE ARTHROPLASTY AND HARDWARE REMOVAL (Left) HARDWARE REMOVAL (Left)  Patient location during evaluation: PACU Anesthesia Type: General Level of consciousness: awake and alert and patient cooperative Pain management: pain level controlled Vital Signs Assessment: post-procedure vital signs reviewed and stable Respiratory status: spontaneous breathing and respiratory function stable Cardiovascular status: stable Anesthetic complications: no       Last Vitals:  Vitals:   10/30/16 1615 10/30/16 1630  BP: (!) 157/102 (!) 154/95  Pulse: 93 92  Resp: 11 10  Temp:      Last Pain:  Vitals:   10/30/16 1615  TempSrc:   PainSc: 0-No pain                 Zalayah Pizzuto S

## 2016-10-30 NOTE — H&P (View-Only) (Signed)
Philip Charles DOB: 1975/12/02 Single / Language: [place] / Race: White Male Date of Admission: 10/30/2016 CC:  Left knee pain History of Present Illness  The patient is a 41 year old male who comes in for a preoperative History and Physical. The patient is scheduled for a left total knee arthroplasty (and hardware removal) to be performed by Dr. Gus Charles. [name], MD at [facility] on 10-30-2016. The patient is a 41 year old male who presented for follow up of their knee. The patient is being followed for their left knee pain and post traumatic arthritis. Symptoms reported include: pain, aching, pain with weightbearing and difficulty ambulating. The patient feels that they are doing poorly. The following medication has been used for pain control: antiinflammatory medication (ibuprofen, prn). Philip Charles has had problems with his left lower extremity since an early age. He had a lawnmower accident as a child. He had a large open soft tissue defect medial and posteromedial aspect of the knee as well as a distal femur fracture. He was treated by Dr. Jimmey Charles at [facility]. He ended up with a shorter left lower extremity and wears a large lift on his shoe now of several inches. He is in today because he has a progressively worsening pain in his left knee. He has always had stiffness and very limited motion in the knee. The pain part is getting much worse. 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 It is getting progressively more difficult to do this work. He is only 40, but really is unable to do any activities that he desires. He did not have any episodes of infection, associated with his soft tissue or bony injuries as a child. 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 He has distal femur fracture, which is well healed. He has a plate intact lateral distal femur. His alignment overall in the femur looks good, though. He  has a varus deformity of the knee with worst degeneration medial. He has got a real significant problem here. He has got a very stiff knee due to the trauma and also has that soft tissue defect medially. Fortunately, there was never history of infection after his initial injury. At this point, the only thing that predictably could improve his pain, would be a total knee arthroplasty or knee fusion. Functionally, knee fusion is probably the worst thing he could have at his age. Knee replacement, although it may not get him normal motion, should improve his pain significantly and give him a more stable platform to walk on. He would have to have the hardware removed, but it is a short plate and is distal enough, where it can be addressed through the standard total knee incision. I do believe that we would do not need to do this in two stages and it can be done successfully in a single stage. I told him our main concerns would be postoperative motion and also soft tissue healing with potential for infection. Without a prior history of infection, his risk is definitely lower. His soft tissues are mobile enough medially where we should not run into any soft tissue breakdown postoperatively. We did discuss knee replacement in detail. I told him that it is going to be somewhat more risky than a standard knee replacement, but also should provide him good benefit. We discussed all this in detail. He would like to proceed with knee replacement. We will  not be able to add any length to his leg, but by correcting his angular deformity, should functionally make the leg feel a few millimeters longer. He is ready to proceed. They have been treated conservatively in the past for the above stated problem and despite conservative measures, they continue to have progressive pain and severe functional limitations and dysfunction. They have failed non-operative management including home exercise, medications. It is felt that they  would benefit from undergoing total joint replacement. Risks and benefits of the procedure have been discussed with the patient and they elect to proceed with surgery. There are no active contraindications to surgery such as ongoing infection or rapidly progressive neurological disease.   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 Congestive Heart Failure  Mother, Paternal Grandmother. Diabetes Mellitus  Paternal Grandmother. Drug / Alcohol Addiction  Maternal Grandfather. Heart Disease  Maternal Grandfather. Hypertension  Mother, Paternal Grandmother. Osteoarthritis  Mother. Rheumatoid Arthritis  Maternal Grandmother.  Social History  Children  0 Current drinker  06/21/2016: Currently drinks beer only occasionally per week Current work status  working full time Living situation  live with parents Marital status  divorced No history of drug/alcohol rehab  Not under pain contract  Tobacco / smoke exposure  06/21/2016: yes outdoors only Tobacco use  Never smoker. 06/21/2016  Medication History  Ibuprofen (  Tablet, Oral as needed) Active. Multivitamin Adult (Oral) Active. Allegra (Oral) Specific strength unknown - Active.  Past Surgical History Appendectomy    Review of Systems General Present- Fever (recent cold that has resolved). Not Present- Chills, Fatigue, Memory Loss, Night Sweats, Weight Gain and Weight Loss. Skin Not Present- Eczema, Hives, Itching, Lesions and Rash. HEENT Present- Hearing Loss. Not Present- Dentures, Double Vision, Headache, Tinnitus and Visual Loss. Respiratory Present- Cough. Not Present- Allergies, Chronic Cough, Coughing up blood, Shortness of breath at rest and Shortness of breath with exertion. Cardiovascular Not Present- Chest Pain, Difficulty  Breathing Lying Down, Murmur, Palpitations, Racing/skipping heartbeats and Swelling. Gastrointestinal Not Present- Abdominal Pain, Bloody Stool, Constipation, Diarrhea, Difficulty Swallowing, Heartburn, Jaundice, Loss of appetitie, Nausea and Vomiting. Male Genitourinary Not Present- Blood in Urine, Discharge, Flank Pain, Incontinence, Painful Urination, Urgency, Urinary frequency, Urinary Retention, Urinating at Night and Weak urinary stream. Musculoskeletal Present- Back Pain and Joint Pain. Not Present- Joint Swelling, Morning Stiffness, Muscle Pain, Muscle Weakness and Spasms. Neurological Not Present- Blackout spells, Difficulty with balance, Dizziness, Paralysis, Tremor and Weakness. Psychiatric Not Present- Insomnia.  Vitals Weight: 197 lb Height: 66in Weight was reported by patient. Height was reported by patient. Body Surface Area: 1.99 m Body Mass Index: 31.8 kg/m  Pulse: 84 (Regular)  BP: 122/72 (Sitting, Right Arm, Standard)  Physical Exam General Mental Status -Alert and cooperative. Note: appears to have somewhat of a flat affect General Appearance-pleasant, Not in acute distress. Orientation-Oriented X3. Build & Nutrition-Well nourished and Well developed.  Head and Neck Head-normocephalic, atraumatic . Neck Global Assessment - supple, no bruit auscultated on the right, no bruit auscultated on the left.  Eye Vision-Wears corrective lenses. Pupil - Bilateral-Regular and Round. Motion - Bilateral-EOMI.  Chest and Lung Exam Auscultation Breath sounds - clear at anterior chest wall and clear at posterior chest wall. Adventitious sounds - No Adventitious sounds.  Cardiovascular Auscultation Rhythm - Regular rate and rhythm. Heart Sounds - S1 WNL and S2 WNL. Murmurs &  Other Heart Sounds - Auscultation of the heart reveals - No Murmurs.  Abdomen Palpation/Percussion Tenderness - Abdomen is non-tender to palpation. Rigidity (guarding) -  Abdomen is soft. Auscultation Auscultation of the abdomen reveals - Bowel sounds normal.  Male Genitourinary Note: Not done, not pertinent to present illness   Musculoskeletal Note: Well-developed male in no distress. He has a well-healed anterior scar and lateral scar. He has a significant soft tissue defect posteromedial. It is not to any open area. The skin is not terribly scarred down. It is very mobile. It is rather thin medially, but as stated, is not fixed to the underlying tissues. His range of motion is about 5 to 80. The knee is tender diffusely. There is no effusion. There is no instability noted. He is several inches short on the left, compared to the right.  RADIOGRAPHS I reviewed his radiographs from the last visit. 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 He has distal femur fracture, which is well healed. He has a plate intact lateral distal femur. His alignment overall in the femur looks good, though. He has a varus deformity of the knee with worst degeneration medial.   Assessment & Plan Post-traumatic osteoarthritis of left knee (Principal Diagnosis) (M17.32)  Note:Surgical Plans: Left Total Knee Replacement with Hardware Removal  Disposition: Home with parents helping after surgery  PCP: Dr. Thayer Headings (Seen the Nurse Practioner for clearance)  IV TXA  Anesthesia Issues: None  Patient was instructed on what medications to stop prior to surgery.  Signed electronically by Lauraine Rinne, III PA-C

## 2016-10-31 ENCOUNTER — Encounter (HOSPITAL_COMMUNITY): Payer: Self-pay | Admitting: Orthopedic Surgery

## 2016-10-31 LAB — BASIC METABOLIC PANEL
ANION GAP: 7 (ref 5–15)
BUN: 10 mg/dL (ref 6–20)
CALCIUM: 8.5 mg/dL — AB (ref 8.9–10.3)
CO2: 25 mmol/L (ref 22–32)
Chloride: 101 mmol/L (ref 101–111)
Creatinine, Ser: 0.6 mg/dL — ABNORMAL LOW (ref 0.61–1.24)
GFR calc non Af Amer: >60 mL/min (ref >60)
Glucose, Bld: 133 mg/dL — ABNORMAL HIGH (ref 65–99)
Potassium: 3.5 mmol/L (ref 3.5–5.1)
Sodium: 133 mmol/L — ABNORMAL LOW (ref 135–145)

## 2016-10-31 LAB — CBC
HCT: 36.1 % — ABNORMAL LOW (ref 39.0–52.0)
HEMOGLOBIN: 12.2 g/dL — AB (ref 13.0–17.0)
MCH: 29.4 pg (ref 26.0–34.0)
MCHC: 33.8 g/dL (ref 30.0–36.0)
MCV: 87 fL (ref 78.0–100.0)
Platelets: 256 10*3/uL (ref 150–400)
RBC: 4.15 MIL/uL — AB (ref 4.22–5.81)
RDW: 13.5 % (ref 11.5–15.5)
WBC: 9.6 10*3/uL (ref 4.0–10.5)

## 2016-10-31 MED ORDER — GABAPENTIN 300 MG PO CAPS: 300.0000 mg | ORAL_CAPSULE | Freq: Two times a day (BID) | ORAL | 0 refills | Status: AC

## 2016-10-31 MED ORDER — METHOCARBAMOL 500 MG PO TABS: 500.0000 mg | ORAL_TABLET | Freq: Four times a day (QID) | ORAL | 0 refills | Status: AC | PRN

## 2016-10-31 MED ORDER — RIVAROXABAN 10 MG PO TABS: 10.0000 mg | ORAL_TABLET | Freq: Every day | ORAL | 0 refills | Status: AC

## 2016-10-31 MED ORDER — TRAMADOL HCL 50 MG PO TABS: 50.0000 mg | ORAL_TABLET | Freq: Four times a day (QID) | ORAL | 0 refills | Status: AC | PRN

## 2016-10-31 MED ORDER — OXYCODONE HCL 5 MG PO TABS: 5.0000 mg | ORAL_TABLET | ORAL | 0 refills | Status: AC | PRN

## 2016-10-31 NOTE — Discharge Instructions (Addendum)
° °Dr. Summers Buendia °Total Joint Specialist °[facility] °[address] °[place], [place] [address] °[phone] ° °TOTAL KNEE REPLACEMENT POSTOPERATIVE DIRECTIONS ° °Knee Rehabilitation, Guidelines Following Surgery  °Results after knee surgery are often greatly improved when you follow the exercise, range of motion and muscle strengthening exercises prescribed by your doctor. Safety measures are also important to protect the knee from further injury. Any time any of these exercises cause you to have increased pain or swelling in your knee joint, decrease the amount until you are comfortable again and slowly increase them. If you have problems or questions, call your caregiver or physical therapist for advice.  ° °HOME CARE INSTRUCTIONS  °Remove items at home which could result in a fall. This includes throw rugs or furniture in walking pathways.  °· ICE to the affected knee every three hours for 30 minutes at a time and then as needed for pain and swelling.  Continue to use ice on the knee for pain and swelling from surgery. You may notice swelling that will progress down to the foot and ankle.  This is normal after surgery.  Elevate the leg when you are not up walking on it.   °· Continue to use the breathing machine which will help keep your temperature down.  It is common for your temperature to cycle up and down following surgery, especially at night when you are not up moving around and exerting yourself.  The breathing machine keeps your lungs expanded and your temperature down. °· Do not place pillow under knee, focus on keeping the knee straight while resting ° °DIET °You may resume your previous home diet once your are discharged from the hospital. ° °DRESSING / WOUND CARE / SHOWERING °You may start showering once you are discharged home but do not submerge the incision under water. Just pat the incision dry and apply a dry gauze dressing on daily. °Change the surgical dressing  daily and reapply a dry dressing each time. ° °ACTIVITY °Walk with your walker as instructed. °Use walker as long as suggested by your caregivers. °Avoid periods of inactivity such as sitting longer than an hour when not asleep. This helps prevent blood clots.  °You may resume a sexual relationship in one month or when given the OK by your doctor.  °You may return to work once you are cleared by your doctor.  °Do not drive a car for 6 weeks or until released by you surgeon.  °Do not drive while taking narcotics. ° °WEIGHT BEARING °Weight bearing as tolerated with assist device (walker, cane, etc) as directed, use it as long as suggested by your surgeon or therapist, typically at least 4-6 weeks. ° °POSTOPERATIVE CONSTIPATION PROTOCOL °Constipation - defined medically as fewer than three stools per week and severe constipation as less than one stool per week. ° °One of the most common issues patients have following surgery is constipation.  Even if you have a regular bowel pattern at home, your normal regimen is likely to be disrupted due to multiple reasons following surgery.  Combination of anesthesia, postoperative narcotics, change in appetite and fluid intake all can affect your bowels.  In order to avoid complications following surgery, here are some recommendations in order to help you during your recovery period. ° °Colace (docusate) - Pick up an over-the-counter form of Colace or another stool softener and take twice a day as long as you are requiring postoperative pain medications.  Take with a full glass of water   daily.  If you experience loose stools or diarrhea, hold the colace until you stool forms back up.  If your symptoms do not get better within 1 week or if they get worse, check with your doctor. ° °Dulcolax (bisacodyl) - Pick up over-the-counter and take as directed by the product packaging as needed to assist with the movement of your bowels.  Take with a full glass of water.  Use this product as  needed if not relieved by Colace only.  ° °MiraLax (polyethylene glycol) - Pick up over-the-counter to have on hand.  MiraLax is a solution that will increase the amount of water in your bowels to assist with bowel movements.  Take as directed and can mix with a glass of water, juice, soda, coffee, or tea.  Take if you go more than two days without a movement. °Do not use MiraLax more than once per day. Call your doctor if you are still constipated or irregular after using this medication for 7 days in a row. ° °If you continue to have problems with postoperative constipation, please contact the office for further assistance and recommendations.  If you experience "the worst abdominal pain ever" or develop nausea or vomiting, please contact the office immediatly for further recommendations for treatment. ° °ITCHING ° If you experience itching with your medications, try taking only a single pain pill, or even half a pain pill at a time.  You can also use Benadryl over the counter for itching or also to help with sleep.  ° °TED HOSE STOCKINGS °Wear the elastic stockings on both legs for three weeks following surgery during the day but you may remove then at night for sleeping. ° °MEDICATIONS °See your medication summary on the “After Visit Summary” that the nursing staff will review with you prior to discharge.  You may have some home medications which will be placed on hold until you complete the course of blood thinner medication.  It is important for you to complete the blood thinner medication as prescribed by your surgeon.  Continue your approved medications as instructed at time of discharge. ° °PRECAUTIONS °If you experience chest pain or shortness of breath - call 911 immediately for transfer to the hospital emergency department.  °If you develop a fever greater that 101 F, purulent drainage from wound, increased redness or drainage from wound, foul odor from the wound/dressing, or calf pain - CONTACT YOUR  SURGEON.   °                                                °FOLLOW-UP APPOINTMENTS °Make sure you keep all of your appointments after your operation with your surgeon and caregivers. You should call the office at the above phone number and make an appointment for approximately two weeks after the date of your surgery or on the date instructed by your surgeon outlined in the "After Visit Summary". ° ° °RANGE OF MOTION AND STRENGTHENING EXERCISES  °Rehabilitation of the knee is important following a knee injury or an operation. After just a few days of immobilization, the muscles of the thigh which control the knee become weakened and shrink (atrophy). Knee exercises are designed to build up the tone and strength of the thigh muscles and to improve knee motion. Often times heat used for twenty to thirty minutes before working out will loosen   up your tissues and help with improving the range of motion but do not use heat for the first two weeks following surgery. These exercises can be done on a training (exercise) mat, on the floor, on a table or on a bed. Use what ever works the best and is most comfortable for you Knee exercises include:  °Leg Lifts - While your knee is still immobilized in a splint or cast, you can do straight leg raises. Lift the leg to 60 degrees, hold for 3 sec, and slowly lower the leg. Repeat 10-20 times 2-3 times daily. Perform this exercise against resistance later as your knee gets better.  °Quad and Hamstring Sets - Tighten up the muscle on the front of the thigh (Quad) and hold for 5-10 sec. Repeat this 10-20 times hourly. Hamstring sets are done by pushing the foot backward against an object and holding for 5-10 sec. Repeat as with quad sets.  °· Leg Slides: Lying on your back, slowly slide your foot toward your buttocks, bending your knee up off the floor (only go as far as is comfortable). Then slowly slide your foot back down until your leg is flat on the floor again. °· Angel Wings:  Lying on your back spread your legs to the side as far apart as you can without causing discomfort.  °A rehabilitation program following serious knee injuries can speed recovery and prevent re-injury in the future due to weakened muscles. Contact your doctor or a physical therapist for more information on knee rehabilitation.  ° °IF YOU ARE TRANSFERRED TO A SKILLED REHAB FACILITY °If the patient is transferred to a skilled rehab facility following release from the hospital, a list of the current medications will be sent to the facility for the patient to continue.  When discharged from the skilled rehab facility, please have the facility set up the patient's Home Health Physical Therapy prior to being released. Also, the skilled facility will be responsible for providing the patient with their medications at time of release from the facility to include their pain medication, the muscle relaxants, and their blood thinner medication. If the patient is still at the rehab facility at time of the two week follow up appointment, the skilled rehab facility will also need to assist the patient in arranging follow up appointment in our office and any transportation needs. ° °MAKE SURE YOU:  °Understand these instructions.  °Get help right away if you are not doing well or get worse.  ° ° °Pick up stool softner and laxative for home use following surgery while on pain medications. °Do not submerge incision under water. °Please use good hand washing techniques while changing dressing each day. °May shower starting three days after surgery. °Please use a clean towel to pat the incision dry following showers. °Continue to use ice for pain and swelling after surgery. °Do not use any lotions or creams on the incision until instructed by your surgeon. ° ° °Information on my medicine - XARELTO® (Rivaroxaban) ° °This medication education was reviewed with me or my healthcare representative as part of my discharge preparation.  The  pharmacist that spoke with me during my hospital stay was:  Bell, Michelle T, RPH ° °Why was Xarelto® prescribed for you? °Xarelto® was prescribed for you to reduce the risk of blood clots forming after orthopedic surgery. The medical term for these abnormal blood clots is venous thromboembolism (VTE). ° °What do you need to know about xarelto® ? °Take your Xarelto® ONCE DAILY   at the same time every day. °You may take it either with or without food. ° °If you have difficulty swallowing the tablet whole, you may crush it and mix in applesauce just prior to taking your dose. ° °Take Xarelto® exactly as prescribed by your doctor and DO NOT stop taking Xarelto® without talking to the doctor who prescribed the medication.  Stopping without other VTE prevention medication to take the place of Xarelto® may increase your risk of developing a clot. ° °After discharge, you should have regular check-up appointments with your healthcare provider that is prescribing your Xarelto®.   ° °What do you do if you miss a dose? °If you miss a dose, take it as soon as you remember on the same day then continue your regularly scheduled once daily regimen the next day. Do not take two doses of Xarelto® on the same day.  ° °Important Safety Information °A possible side effect of Xarelto® is bleeding. You should call your healthcare provider right away if you experience any of the following: °? Bleeding from an injury or your nose that does not stop. °? Unusual colored urine (red or dark brown) or unusual colored stools (red or black). °? Unusual bruising for unknown reasons. °? A serious fall or if you hit your head (even if there is no bleeding). ° °Some medicines may interact with Xarelto® and might increase your risk of bleeding while on Xarelto®. To help avoid this, consult your healthcare provider or pharmacist prior to using any new prescription or non-prescription medications, including herbals, vitamins, non-steroidal  anti-inflammatory drugs (NSAIDs) and supplements. ° °This website has more information on Xarelto®: www.xarelto.com. ° ° °

## 2016-10-31 NOTE — Addendum Note (Signed)
Addendum  created 10/31/16 1001 by Elyn Peers, CRNA   Charge Capture section accepted

## 2016-10-31 NOTE — Progress Notes (Signed)
10/31/16 1516  PT Visit Information  Last PT Received On 10/31/16  Assistance Needed +1  History of Present Illness Pt is a 41 year old male s/p L TKA with hardware removal. Per notes, "Pt had a lawnmower accident as a child. He had a large open soft tissue defect medial and posteromedial aspect of the knee as well as a distal femur fracture. He was treated by Dr. Jimmey Ralph at [facility]. He ended up with a shorter left lower extremity and wears a large lift on his shoe now of several inches."  Subjective Data  Subjective Pt assisted with performing TKA exercises in the recliner and then ambulated in the hallway.  Pt tolerated improved distance.  Pt handed off to OT once back to room.  Precautions  Precautions Fall;Knee  Precaution Comments limit flexion to 30 degrees, also hx of LLD and wears special shoes (L shoe with more of a lift which may need adjusting s/p L TKA)  Required Braces or Orthoses Knee Immobilizer - Left  Restrictions  LLE Weight Bearing WBAT  Pain Assessment  Pain Assessment 0-10  Pain Score 4  Pain Location L knee  Pain Descriptors / Indicators Aching;Sore  Pain Intervention(s) Limited activity within patient's tolerance;Monitored during session  Cognition  Arousal/Alertness Awake/alert  Behavior During Therapy WFL for tasks assessed/performed  Overall Cognitive Status Within Functional Limits for tasks assessed  Transfers  Overall transfer level Needs assistance  Equipment used Rolling walker (2 wheeled)  Transfers Sit to/from Stand  Sit to Stand Min guard  General transfer comment verbal cues for UE and LE positioning, performs better with armrests  Ambulation/Gait  Ambulation/Gait assistance Min guard  Ambulation Distance (Feet) 80 Feet  Assistive device Rolling walker (2 wheeled)  Gait Pattern/deviations Step-to pattern;Decreased stance time - left;Antalgic  General Gait Details pt able to place foot to floor better this afternoon, verbal cues for neutral L  LE position, step length, and RW distance  Exercises  Exercises Total Joint  Total Joint Exercises  Ankle Circles/Pumps AROM;Both;10 reps  Quad Sets AROM;Both;10 reps  Heel Slides AAROM;Left;10 reps;Limitations  Hip ABduction/ADduction AAROM;Left;10 reps  Straight Leg Raises AAROM;Left;10 reps  Heel Slides Limitations within 30 deg knee flexion precaution  PT - End of Session  Equipment Utilized During Treatment Left knee immobilizer  Activity Tolerance Patient tolerated treatment well  Patient left Other (comment) (with OT)  PT - Assessment/Plan  PT Plan Current plan remains appropriate  PT Visit Diagnosis Pain;Other abnormalities of gait and mobility (R26.89)  Pain - Right/Left Left  Pain - part of body Knee  PT Frequency (ACUTE ONLY) 7X/week  Follow Up Recommendations Home health PT;Supervision/Assistance - 24 hour  PT equipment None recommended by PT  AM-PAC PT "6 Clicks" Daily Activity Outcome Measure  Difficulty turning over in bed (including adjusting bedclothes, sheets and blankets)? 3  Difficulty moving from lying on back to sitting on the side of the bed?  3  Difficulty sitting down on and standing up from a chair with arms (e.g., wheelchair, bedside commode, etc,.)? 3  Help needed moving to and from a bed to chair (including a wheelchair)? 3  Help needed walking in hospital room? 3  Help needed climbing 3-5 steps with a railing?  3  6 Click Score 18  Mobility G Code  CK  PT Goal Progression  Progress towards PT goals Progressing toward goals  PT Time Calculation  PT Start Time (ACUTE ONLY) 1355  PT Stop Time (ACUTE ONLY) 1411  PT Time Calculation (min) (ACUTE ONLY) 16 min  PT General Charges  $$ ACUTE PT VISIT 1 Procedure  PT Treatments  $Therapeutic Exercise 8-22 mins   Zenovia Jarred, PT, DPT 10/31/2016 Pager: [phone]

## 2016-10-31 NOTE — Evaluation (Signed)
Occupational Therapy Evaluation Patient Details Name: Philip Charles MRN: [id number] DOB: Jan 09, 1976 Today's Date: 10/31/2016    History of Present Illness Pt is a 41 year old male s/p L TKA with hardware removal. Per notes, "Pt had a lawnmower accident as a child. He had a large open soft tissue defect medial and posteromedial aspect of the knee as well as a distal femur fracture. He was treated by Dr. Jimmey Ralph at [facility]. He ended up with a shorter left lower extremity and wears a large lift on his shoe now of several inches."   Clinical Impression   Pt was admitted for the above sx. All education was completed. No further OT is needed at this time    Follow Up Recommendations  No OT follow up;Supervision/Assistance - 24 hour    Equipment Recommendations  None recommended by OT    Recommendations for Other Services       Precautions / Restrictions Precautions Precautions: Fall;Knee Precaution Comments: limit flexion to 30 degrees, also hx of LLD and wears special shoes (L shoe with more of a lift which may need adjusting s/p L TKA) Required Braces or Orthoses: Knee Immobilizer - Left Restrictions LLE Weight Bearing: Weight bearing as tolerated      Mobility Bed Mobility Overal bed mobility: Needs Assistance Bed Mobility: Supine to Sit     Supine to sit: Min assist;HOB elevated Sit to supine: Min assist   General bed mobility comments: assist for LLE  Transfers Overall transfer level: Needs assistance Equipment used: Rolling walker (2 wheeled) Transfers: Sit to/from Stand Sit to Stand: Min assist         General transfer comment: assist to rise and steady. Cues for UE/LE placement    Balance                                           ADL either performed or assessed with clinical judgement   ADL Overall ADL's : Needs assistance/impaired     Grooming: Min guard;Standing   Upper Body Bathing: Set up;Sitting   Lower Body Bathing:  Moderate assistance;Bed level   Upper Body Dressing : Set up;Sitting   Lower Body Dressing: Maximal assistance;Sit to/from stand   Toilet Transfer: Minimal assistance;Ambulation;BSC;RW   Toileting- Clothing Manipulation and Hygiene: Minimal assistance;Sit to/from stand   Tub/ Shower Transfer: Walk-in shower;Minimal assistance;Ambulation     General ADL Comments: mother present and will assist pt at home. He will use her shower.  Pt practiced bathroom transfers, and handout given for shower transfer     Vision         Perception     Praxis      Pertinent Vitals/Pain Pain Assessment: Faces Pain Score: 4  Pain Location: Lknee Pain Descriptors / Indicators: Aching;Sore Pain Intervention(s): Limited activity within patient's tolerance;Monitored during session;Premedicated before session;Repositioned     Hand Dominance     Extremity/Trunk Assessment Upper Extremity Assessment Upper Extremity Assessment: Overall WFL for tasks assessed          Communication Communication Communication: No difficulties   Cognition Arousal/Alertness: Awake/alert Behavior During Therapy: WFL for tasks assessed/performed Overall Cognitive Status: Within Functional Limits for tasks assessed                                     General  Comments       Exercises     Shoulder Instructions      Home Living Family/patient expects to be discharged to:: Private residence Living Arrangements: Parent Available Help at Discharge: Family Type of Home: House Home Access: Ramped entrance     Home Layout: Able to live on main level with bedroom/bathroom     Bathroom Shower/Tub: Producer, television/film/video: Standard     Home Equipment: Environmental consultant - 2 wheels;Cane - single point;Crutches;Bedside commode (will use 3:1 as shower seat)          Prior Functioning/Environment Level of Independence: Independent                 OT Problem List:        OT  Treatment/Interventions:      OT Goals(Current goals can be found in the care plan section) Acute Rehab OT Goals Patient Stated Goal: none stated OT Goal Formulation: All assessment and education complete, DC therapy  OT Frequency:     Barriers to D/C:            Co-evaluation              End of Session    Activity Tolerance: Patient tolerated treatment well Patient left: in bed;with call bell/phone within reach;with family/visitor present  OT Visit Diagnosis: Pain Pain - Right/Left: Left Pain - part of body: Knee                Time: 1610-9604 OT Time Calculation (min): 16 min Charges:  OT General Charges $OT Visit: 1 Procedure OT Evaluation $OT Eval Low Complexity: 1 Procedure G-Codes:     Ramona, OTR/L [phone] 10/31/2016  Emanie Behan 10/31/2016, 2:56 PM

## 2016-10-31 NOTE — Care Management Note (Signed)
Case Management Note  Patient Details  Name: Philip Charles MRN: [id number] Date of Birth: Sep 10, 1975  Subjective/Objective:   41 yo admitted for  L TKA with hardware removal.              Action/Plan: Pt for home with mother. Choice offered for HHPT and [facility] chosen. [facility] rep aware of referral. Pt has RW, Cane, Crutches, and Bedside commode at home. Will need HHPT orders at discharge. No other CM needs communicated.      Expected Discharge Date:  11/01/16               Expected Discharge Plan:  Home w Home Health Services  In-House Referral:     Discharge planning Services  CM Consult  Post Acute Care Choice:    Choice offered to:  Patient  DME Arranged:    DME Agency:     HH Arranged:  PT HH Agency:  [facility] (now [facility])  Status of Service:  Completed, signed off  If discussed at Long Length of Stay Meetings, dates discussed:    Additional CommentsBartholome Bill, RN 10/31/2016, 2:44 PM [phone]

## 2016-10-31 NOTE — Evaluation (Signed)
Physical Therapy Evaluation Patient Details Name: Philip Charles MRN: [id number] DOB: [birth year] Today's Date: 10/31/2016   History of Present Illness  Pt is a 41 year old male s/p L TKA with hardware removal. Per notes, "Pt had a lawnmower accident as a child. He had a large open soft tissue defect medial and posteromedial aspect of the knee as well as a distal femur fracture. He was treated by Dr. Jimmey Ralph at [facility]. He ended up with a shorter left lower extremity and wears a large lift on his shoe now of several inches."  Clinical Impression  Pt is s/p TKA resulting in the deficits listed below (see PT Problem List).  Pt will benefit from skilled PT to increase their independence and safety with mobility to allow discharge to the venue listed below.  Pt reports increased knee pain with mobility, maintained KI.  Pt with plans to d/c home with parents and receive HHPT.     Follow Up Recommendations Home health PT;Supervision/Assistance - 24 hour    Equipment Recommendations  None recommended by PT    Recommendations for Other Services       Precautions / Restrictions Precautions Precautions: Fall;Knee Precaution Comments: limit flexion to 30 degrees, also hx of LLD and wears special shoes (L shoe with more of a lift which may need adjusting s/p L TKA) Required Braces or Orthoses: Knee Immobilizer - Left Restrictions LLE Weight Bearing: Weight bearing as tolerated      Mobility  Bed Mobility Overal bed mobility: Needs Assistance Bed Mobility: Supine to Sit     Supine to sit: Min assist;HOB elevated     General bed mobility comments: verbal cues for technique, assist for L LE  Transfers Overall transfer level: Needs assistance Equipment used: Rolling walker (2 wheeled) Transfers: Sit to/from Stand Sit to Stand: Min assist;From elevated surface         General transfer comment: verbal cues for UE and LE positioning, assist to rise and steady as well as control  descent  Ambulation/Gait Ambulation/Gait assistance: Min guard Ambulation Distance (Feet): 60 Feet Assistive device: Rolling walker (2 wheeled) Gait Pattern/deviations: Step-to pattern;Decreased stance time - left;Antalgic     General Gait Details: pt mostly kept L foot off floor initially due to pain, cues for sequence, RW positioning, step length  Stairs            Wheelchair Mobility    Modified Rankin (Stroke Patients Only)       Balance                                             Pertinent Vitals/Pain Pain Assessment: 0-10 Pain Score: 3  Pain Location: L knee Pain Descriptors / Indicators: Aching;Sore Pain Intervention(s): Limited activity within patient's tolerance;Monitored during session;Repositioned;Premedicated before session;Ice applied    Home Living Family/patient expects to be discharged to:: Private residence Living Arrangements: Parent Available Help at Discharge: Family Type of Home: House Home Access: Ramped entrance     Home Layout: Able to live on main level with bedroom/bathroom Home Equipment: Walker - 2 wheels;Cane - single point;Crutches;Bedside commode      Prior Function Level of Independence: Independent               Hand Dominance        Extremity/Trunk Assessment        Lower Extremity Assessment Lower  Extremity Assessment: LLE deficits/detail LLE Deficits / Details: fair quad contraction, unable to perform SLR, maintained KI, per precautions flexion is limited to 30* at this time however did not perform due to pain       Communication   Communication: No difficulties  Cognition Arousal/Alertness: Awake/alert Behavior During Therapy: WFL for tasks assessed/performed Overall Cognitive Status: Within Functional Limits for tasks assessed                                        General Comments      Exercises     Assessment/Plan    PT Assessment Patient needs continued  PT services  PT Problem List Decreased strength;Decreased range of motion;Decreased mobility;Pain;Decreased knowledge of precautions;Decreased knowledge of use of DME       PT Treatment Interventions Functional mobility training;Gait training;DME instruction;Therapeutic exercise;Therapeutic activities;Patient/family education    PT Goals (Current goals can be found in the Care Plan section)  Acute Rehab PT Goals PT Goal Formulation: With patient Time For Goal Achievement: 11/04/16 Potential to Achieve Goals: Good    Frequency 7X/week   Barriers to discharge        Co-evaluation               End of Session Equipment Utilized During Treatment: Gait belt;Left knee immobilizer Activity Tolerance: Patient limited by pain Patient left: in chair;with call bell/phone within reach;with family/visitor present;with chair alarm set   PT Visit Diagnosis: Pain;Other abnormalities of gait and mobility (R26.89) Pain - Right/Left: Left Pain - part of body: Knee    Time: 1610-9604 PT Time Calculation (min) (ACUTE ONLY): 16 min   Charges:   PT Evaluation $PT Eval Low Complexity: 1 Procedure     PT G Codes:        Zenovia Jarred, PT, DPT 10/31/2016 Pager: [phone]   Maida Sale E 10/31/2016, 1:25 PM

## 2016-10-31 NOTE — Progress Notes (Signed)
   Subjective: 1 Day Post-Op Procedure(s) (LRB): LEFT TOTAL KNEE ARTHROPLASTY AND HARDWARE REMOVAL (Left) HARDWARE REMOVAL (Left) Patient reports pain as moderate.   Patient seen in rounds for Dr. Lequita Halt. Patient is well, and has had no acute complaints or problems other than pain in the left knee. No SOB or chest pain. Patient's mother in room. Patient quite drowsy from pain meds.  Plan is to go Home after hospital stay.  Objective: Vital signs in last 24 hours: Temp:  [97.5 F (36.4 C)-99 F (37.2 C)] 99 F (37.2 C) (04/03 0547) Pulse Rate:  [61-102] 95 (04/03 0547) Resp:  [10-28] 17 (04/03 0547) BP: (114-157)/(74-102) 114/74 (04/03 0547) SpO2:  [94 %-100 %] 96 % (04/03 0547) Weight:  [90.2 kg (198 lb 12 oz)] 90.2 kg (198 lb 12 oz) (04/02 1107)  Intake/Output from previous day:  Intake/Output Summary (Last 24 hours) at 10/31/16 0725 Last data filed at 10/31/16 0600  Gross per 24 hour  Intake             5180 ml  Output             2715 ml  Net             2465 ml     Labs:  Recent Labs  10/30/16 1735 10/31/16 0408  HGB 13.1 12.2*    Recent Labs  10/30/16 1735 10/31/16 0408  WBC 10.5 9.6  RBC 4.58 4.15*  HCT 38.9* 36.1*  PLT 275 256    Recent Labs  10/30/16 1735 10/31/16 0408  NA 135 133*  K 3.9 3.5  CL 102 101  CO2 25 25  BUN 17 10  CREATININE 0.73 0.60*  GLUCOSE 137* 133*  CALCIUM 8.8* 8.5*    EXAM General - Patient is Alert and Oriented Extremity - Neurologically intact Intact pulses distally Dorsiflexion/Plantar flexion intact Compartment soft Dressing - dressing C/D/I Motor Function - intact, moving foot and toes well on exam.  Hemovac pulled without difficulty.  Past Medical History:  Diagnosis Date  . Arthritis   . Headache     Assessment/Plan: 1 Day Post-Op Procedure(s) (LRB): LEFT TOTAL KNEE ARTHROPLASTY AND HARDWARE REMOVAL (Left) HARDWARE REMOVAL (Left) Principal Problem:   OA (osteoarthritis) of knee  Estimated body  mass index is 31.13 kg/m as calculated from the following:   Height as of this encounter:  (1.702 m).   Weight as of this encounter: 90.2 kg (198 lb 12 oz). Advance diet Up with therapy D/C IV fluids when tolerating POs well  DVT Prophylaxis - Xarelto Weight-Bearing as tolerated D/C O2 and Pulse OX and try on Room Air  Will start therapy today. Will monitor patient closely due to drowsiness from pain medication. Plan for DC home tomorrow with HHPT pending progress.   Dimitri Ped, PA-C Orthopaedic Surgery 10/31/2016, 7:25 AM

## 2016-11-01 LAB — BASIC METABOLIC PANEL
Anion gap: 7 (ref 5–15)
BUN: 10 mg/dL (ref 6–20)
CHLORIDE: 101 mmol/L (ref 101–111)
CO2: 27 mmol/L (ref 22–32)
CREATININE: 0.57 mg/dL — AB (ref 0.61–1.24)
Calcium: 8.5 mg/dL — ABNORMAL LOW (ref 8.9–10.3)
Glucose, Bld: 145 mg/dL — ABNORMAL HIGH (ref 65–99)
POTASSIUM: 3.8 mmol/L (ref 3.5–5.1)
SODIUM: 135 mmol/L (ref 135–145)

## 2016-11-01 LAB — CBC
HCT: 35.2 % — ABNORMAL LOW (ref 39.0–52.0)
Hemoglobin: 11.7 g/dL — ABNORMAL LOW (ref 13.0–17.0)
MCH: 29.4 pg (ref 26.0–34.0)
MCHC: 33.2 g/dL (ref 30.0–36.0)
MCV: 88.4 fL (ref 78.0–100.0)
Platelets: 244 10*3/uL (ref 150–400)
RBC: 3.98 MIL/uL — AB (ref 4.22–5.81)
RDW: 14.1 % (ref 11.5–15.5)
WBC: 8.8 10*3/uL (ref 4.0–10.5)

## 2016-11-01 NOTE — Progress Notes (Signed)
qPhysical Therapy Treatment Patient Details Name: Philip Charles MRN: [id number] DOB: August 14, 1975 Today's Date: 11/01/2016    History of Present Illness Pt is a 41 year old male s/p L TKA with hardware removal. Per notes, "Pt had a lawnmower accident as a child. He had a large open soft tissue defect medial and posteromedial aspect of the knee as well as a distal femur fracture. He was treated by Dr. Jimmey Ralph at [facility]. He ended up with a shorter left lower extremity and wears a large lift on his shoe now of several inches."    PT Comments    Pt performed LE exercises in supine and then pt assisted with donning KI.  Pt ambulated in hallway and progressing well.  Pt to d/c home today.  Pt and pt's mother had no further questions.  Follow Up Recommendations  Home health PT;Supervision/Assistance - 24 hour     Equipment Recommendations  None recommended by PT    Recommendations for Other Services       Precautions / Restrictions Precautions Precautions: Fall;Knee Precaution Comments: limit flexion to 30 degrees, also hx of LLD and wears special shoes (L shoe with more of a lift which may need adjusting s/p L TKA) Required Braces or Orthoses: Knee Immobilizer - Left Restrictions LLE Weight Bearing: Weight bearing as tolerated    Mobility  Bed Mobility Overal bed mobility: Needs Assistance Bed Mobility: Supine to Sit     Supine to sit: Min assist;HOB elevated     General bed mobility comments: assist for LLE  Transfers Overall transfer level: Needs assistance Equipment used: Rolling walker (2 wheeled) Transfers: Sit to/from Stand Sit to Stand: Min guard         General transfer comment: verbal cues for UE and LE positioning, performs better with armrests  Ambulation/Gait Ambulation/Gait assistance: Min guard Ambulation Distance (Feet): 80 Feet Assistive device: Rolling walker (2 wheeled) Gait Pattern/deviations: Step-to pattern;Decreased stance time -  left;Antalgic     General Gait Details: tolerated step to pattern better today, cues for RW distance and step length   Stairs            Wheelchair Mobility    Modified Rankin (Stroke Patients Only)       Balance                                            Cognition Arousal/Alertness: Awake/alert Behavior During Therapy: WFL for tasks assessed/performed Overall Cognitive Status: Within Functional Limits for tasks assessed                                        Exercises Total Joint Exercises Ankle Circles/Pumps: AROM;Both;10 reps Quad Sets: AROM;Both;10 reps Towel Squeeze: AROM;Both;10 reps Heel Slides: AAROM;Left;10 reps;Limitations Heel Slides Limitations: within 30 deg knee flexion precaution Hip ABduction/ADduction: AAROM;Left;10 reps Straight Leg Raises: AAROM;Left;10 reps    General Comments        Pertinent Vitals/Pain Pain Assessment: 0-10 Pain Score: 4  Pain Location: L knee Pain Descriptors / Indicators: Aching;Sore Pain Intervention(s): Limited activity within patient's tolerance;Monitored during session    Home Living                      Prior Function  PT Goals (current goals can now be found in the care plan section) Progress towards PT goals: Progressing toward goals    Frequency    7X/week      PT Plan Current plan remains appropriate    Co-evaluation             End of Session Equipment Utilized During Treatment: Left knee immobilizer Activity Tolerance: Patient tolerated treatment well Patient left: with family/visitor present;Other (comment) (requested to leave in bathroom, pt's mother in room and to call for assist) Nurse Communication: Mobility status PT Visit Diagnosis: Pain;Other abnormalities of gait and mobility (R26.89) Pain - Right/Left: Left Pain - part of body: Knee     Time: 9604-5409 PT Time Calculation (min) (ACUTE ONLY): 26 min  Charges:   $Gait Training: 8-22 mins $Therapeutic Exercise: 8-22 mins                    G Codes:       Zenovia Jarred, PT, DPT 11/01/2016 Pager: [phone]    Maida Sale E 11/01/2016, 2:29 PM

## 2016-11-01 NOTE — Progress Notes (Signed)
Pt to d/c home with [facility] for PT. No DME needs. Prescriptions given to patient. AVS reviewed and "My Chart" discussed with pt. Pt capable of verbalizing medications, dressing changes, signs and symptoms of infection, and follow-up appointments. Remains hemodynamically stable. No signs and symptoms of distress. Educated pt to return to ER in the case of SOB, dizziness, or chest pain.

## 2016-11-01 NOTE — Progress Notes (Signed)
   Subjective: 2 Days Post-Op Procedure(s) (LRB): LEFT TOTAL KNEE ARTHROPLASTY AND HARDWARE REMOVAL (Left) HARDWARE REMOVAL (Left) Patient reports pain as mild.   Patient seen in rounds with Dr. Lequita Halt. Patient is well, and has had no acute complaints or problems. No issues overnight. No SOB and chest pain. Voiding well. Positive flatus.  Plan is to go Home after hospital stay.  Objective: Vital signs in last 24 hours: Temp:  [97.8 F (36.6 C)-100.1 F (37.8 C)] 100.1 F (37.8 C) (04/04 0605) Pulse Rate:  [93-115] 115 (04/04 0605) Resp:  [16-18] 16 (04/04 0605) BP: (120-140)/(69-84) 140/84 (04/04 0605) SpO2:  [95 %-97 %] 95 % (04/04 0605)  Intake/Output from previous day:  Intake/Output Summary (Last 24 hours) at 11/01/16 0802 Last data filed at 11/01/16 0600  Gross per 24 hour  Intake             2460 ml  Output             1300 ml  Net             1160 ml     Labs:  Recent Labs  10/30/16 1735 10/31/16 0408 11/01/16 0415  HGB 13.1 12.2* 11.7*    Recent Labs  10/31/16 0408 11/01/16 0415  WBC 9.6 8.8  RBC 4.15* 3.98*  HCT 36.1* 35.2*  PLT 256 244    Recent Labs  10/31/16 0408 11/01/16 0415  NA 133* 135  K 3.5 3.8  CL 101 101  CO2 25 27  BUN 10 10  CREATININE 0.60* 0.57*  GLUCOSE 133* 145*  CALCIUM 8.5* 8.5*    EXAM General - Patient is Alert and Oriented Extremity - Neurologically intact Intact pulses distally Dorsiflexion/Plantar flexion intact No cellulitis present Compartment soft Dressing/Incision - clean, dry, no drainage Motor Function - intact, moving foot and toes well on exam.   Past Medical History:  Diagnosis Date  . Arthritis   . Headache     Assessment/Plan: 2 Days Post-Op Procedure(s) (LRB): LEFT TOTAL KNEE ARTHROPLASTY AND HARDWARE REMOVAL (Left) HARDWARE REMOVAL (Left) Principal Problem:   OA (osteoarthritis) of knee  Estimated body mass index is 31.13 kg/m as calculated from the following:   Height as of this  encounter:  (1.702 m).   Weight as of this encounter: 90.2 kg (198 lb 12 oz). Advance diet Up with therapy Discharge home with home health  DVT Prophylaxis - Xarelto Weight-Bearing as tolerated   Plan for DC home today with HHPT. Will continue therapy in house today. Discharge instructions given.  Dimitri Ped, PA-C Orthopaedic Surgery 11/01/2016, 8:02 AM

## 2016-11-01 NOTE — Op Note (Signed)
Philip Charles, Philip Charles              ACCOUNT NO.:  [id number]  MEDICAL RECORD NO.:  [id number]  LOCATION:                                 FACILITY:  PHYSICIAN:  Ollen Gross, M.D.         DATE OF BIRTH:  DATE OF PROCEDURE:  10/30/2016 DATE OF DISCHARGE:                              OPERATIVE REPORT   PREOPERATIVE DIAGNOSIS:  Left knee posttraumatic osteoarthritis.  POSTOPERATIVE DIAGNOSIS:  Left knee posttraumatic osteoarthritis.  PROCEDURE:  Left total knee arthroplasty with hardware removal.  SURGEON:  Ollen Gross, M.D.  ASSISTANT:  Jaquelyn Bitter. Chabon, P.A.  ANESTHESIA:  General with adductor canal block.  ESTIMATED BLOOD LOSS:  Minimal.  DRAINS:  Hemovac x1.  TOURNIQUET TIME:  114 minutes at 300 mmHg.  COMPLICATIONS:  None.  CONDITION:  Stable to recovery.  BRIEF CLINICAL NOTE:  Philip Charles is a 41 year old male, who had a traumatic incident to his left leg at age 41 from a lawnmower.  He sustained soft tissue injury and fracture.  He was treated with distal femoral plating and soft-tissue reconstruction.  He has gone on to develop severe tricompartmental bone-on-bone osteoarthritis.  This has been refractory to nonoperative management including cortisone injections.  It was felt that the only predictable means of improving his pain and function would be a total knee arthroplasty.  He presents today for removal of the femoral plate and for a total knee arthroplasty.  PROCEDURE IN DETAIL:  After successful administration of adductor canal block and then general anesthetic, a tourniquet was placed high on his left thigh and his left lower extremity was prepped and draped in the usual sterile fashion.  Extremity was wrapped in Esmarch, and tourniquet inflated to 300 mmHg.  A midline incision was made with a 10 blade through subcutaneous tissue to the level of the extensor mechanism.  A fresh blade was used to make a medial parapatellar arthrotomy.  Soft tissue on the  proximal medial tibia was subperiosteally elevated to the joint line with a knife and into the semimembranosus bursa with a Cobb elevator.  There was some massive medial osteophyte on the tibia which was removed.  This allowed for much better soft tissue exposure.  The soft tissue on the proximal lateral tibia was also elevated with attention being paid to avoiding the patellar tendon on tibial tubercle. The patella was everted.  His preoperative flexion was only 20 degrees. There was a significant amount of scarring in the suprapatellar pouch and I sequentially released that scarring as knee flexed over 90 degrees.  We were also able to expose the lateral plate through the anterior incision.  Unfortunately, bone had overgrown the plate almost throughout its entire extent.  I was able to visualize the superior distal most screw and was able to remove that.  I had to chip off superficial bone to expose the rest of the plate.  There were 4 other screws proximal on the plate and had to remove bone from the screw head in order to get the screwdriver to engage and remove those 4 screws. The final screw which was distal and posterior which tripped.  We tried multiple devices to remove the  screw, but it would not budge.  I decided to access the screw through the canal since we were going to be entering the femoral canal anyway.  The drill was used to create a starting hole in the distal femur.  I saw the screw in there and using a burr and osteotome sections of the screw to remove the distal portion of the screw and gain access to the femoral canal.  We thoroughly irrigated the canal and then placed the 5-degree left valgus alignment guide to remove 10 mm off the distal femur.  A 10 mm was a fair resection off the lateral side, but medially did not get any bone and had to go 2 more mm to even skim the medial surface.  I decided I was going to use a 4 mm medial augment, so I went for 4 more mm  on the medial side in order to get decent bone.  The distal femoral resection was made with an oscillating saw.  The tibia then subluxed forward and meniscal remnant removed.  I removed the rest of the tibial osteophyte from the medial side.  The extramedullary tibial alignment guide was placed referencing proximally at the medial aspect of tibial tubercle and distally along the second metatarsal axis and tibial crest.  Block was pinned to remove about 2 mm off the more deficient lateral side.  Tibial resection was made with an oscillating saw.  Size 4 was the most appropriate tibial component and proximal tibia prepared to modular drill and keel punch for the size 4.  On the femoral side, the sizing block was placed.  Size 4 was most appropriate there also.  Rotation was marked off the epicondylar axis and confirmed by creating a rectangular flexion gap at 90 degrees.  The block was pinned in this rotation and the anterior-posterior and chamfer cuts made.  The trial was placed with a 4 mm distal medial augment.  I had excellent purchase.  We got to a 17.5 mm trial insert, which allowed for full extension with excellent varus-valgus and anterior-posterior balance throughout full range of motion.  The patella was again everted, thickness measured to be 24 mm.  Freehand resection was taken to 14 mm, 38 template was placed, lug holes were drilled, trial patella was placed and it tracks normally.  Flexion with the patella centered in a tree in the trochlear groove was only about 70 degrees.  When we everted the patella and there was nothing in her groom.  I could flex him only back to 110-120.  The tibial components were removed and then osteophytes were removed off the posterior femur.  All trials were removed and the cut bone surfaces were prepared with pulsatile lavage.  Cement was mixed and once ready for implantation, a size 4 MBT tibial tray with a size 4 posterior stabilized femur  with 4 mm distal medial augment and a 38 patellar cemented into place.  Patella was held with a clamp.  Trial 17.5 mm posterior stabilized rotating platform insert was placed in tibial tray and the knee was held in full extension.  All extruded cement was removed.  Once the cement was fully hardened then the permanent 17.5 mm poster stabilized rotating platform insert was placed in the tibial tray.  Wound was copiously irrigated with saline solution. A 20 mL of Exparel mixed with 30 mL of saline was injected into the subcu tissues, periosteum of the femur, and the extensor mechanism.  The wound was again copiously  irrigated with saline solution.  The arthrotomy closed over Hemovac drain with a running #1 StrataFix suture. The flexion was only about 40 degrees against gravity, and I did not want to push further as the tissues were rather tenuous.  Tourniquet was then released with total tourniquet time 114 minutes.  Subcu then was closed with interrupted 2-0 Vicryl and subcuticular running 4-0 Monocryl.  We did not note any unusual bleeding at the time the tourniquet was released.  The skin was closed with staples.  Incisions were cleaned and dried and a bulky sterile dressing applied.  He was placed into a knee immobilizer, awakened and transferred to recovery in stable condition.  Note that a surgical assistant was a medical necessity for this procedure to do it in a safe and expeditious manner.  Surgical assistant was vital for retraction of neurovascular structures and for proper positioning of the limb to allow for proper placement of the new prosthesis.     Ollen Gross, M.D.     FA/MEDQ  D:  10/30/2016  T:  10/30/2016  Job:  [id number]

## 2016-11-03 DIAGNOSIS — Z7901 Long term (current) use of anticoagulants: Secondary | ICD-10-CM | POA: Diagnosis not present

## 2016-11-03 DIAGNOSIS — Z96652 Presence of left artificial knee joint: Secondary | ICD-10-CM | POA: Diagnosis not present

## 2016-11-03 DIAGNOSIS — Z79891 Long term (current) use of opiate analgesic: Secondary | ICD-10-CM | POA: Diagnosis not present

## 2016-11-03 DIAGNOSIS — Z471 Aftercare following joint replacement surgery: Secondary | ICD-10-CM | POA: Diagnosis not present

## 2016-11-06 DIAGNOSIS — Z7901 Long term (current) use of anticoagulants: Secondary | ICD-10-CM | POA: Diagnosis not present

## 2016-11-06 DIAGNOSIS — Z96652 Presence of left artificial knee joint: Secondary | ICD-10-CM | POA: Diagnosis not present

## 2016-11-06 DIAGNOSIS — Z79891 Long term (current) use of opiate analgesic: Secondary | ICD-10-CM | POA: Diagnosis not present

## 2016-11-06 DIAGNOSIS — Z471 Aftercare following joint replacement surgery: Secondary | ICD-10-CM | POA: Diagnosis not present

## 2016-11-08 DIAGNOSIS — Z96652 Presence of left artificial knee joint: Secondary | ICD-10-CM | POA: Diagnosis not present

## 2016-11-08 DIAGNOSIS — Z471 Aftercare following joint replacement surgery: Secondary | ICD-10-CM | POA: Diagnosis not present

## 2016-11-08 DIAGNOSIS — Z7901 Long term (current) use of anticoagulants: Secondary | ICD-10-CM | POA: Diagnosis not present

## 2016-11-08 DIAGNOSIS — Z79891 Long term (current) use of opiate analgesic: Secondary | ICD-10-CM | POA: Diagnosis not present

## 2016-11-10 DIAGNOSIS — Z471 Aftercare following joint replacement surgery: Secondary | ICD-10-CM | POA: Diagnosis not present

## 2016-11-10 DIAGNOSIS — Z96652 Presence of left artificial knee joint: Secondary | ICD-10-CM | POA: Diagnosis not present

## 2016-11-10 DIAGNOSIS — Z7901 Long term (current) use of anticoagulants: Secondary | ICD-10-CM | POA: Diagnosis not present

## 2016-11-10 DIAGNOSIS — Z79891 Long term (current) use of opiate analgesic: Secondary | ICD-10-CM | POA: Diagnosis not present

## 2016-11-15 DIAGNOSIS — Z471 Aftercare following joint replacement surgery: Secondary | ICD-10-CM | POA: Diagnosis not present

## 2016-11-15 DIAGNOSIS — Z7901 Long term (current) use of anticoagulants: Secondary | ICD-10-CM | POA: Diagnosis not present

## 2016-11-15 DIAGNOSIS — Z96652 Presence of left artificial knee joint: Secondary | ICD-10-CM | POA: Diagnosis not present

## 2016-11-15 DIAGNOSIS — Z79891 Long term (current) use of opiate analgesic: Secondary | ICD-10-CM | POA: Diagnosis not present

## 2016-11-16 NOTE — Discharge Summary (Signed)
Physician Discharge Summary   Patient ID: COHAN STIPES MRN: [id number] DOB/AGE: [birth year] 41 y.o.  Admit date: 10/30/2016 Discharge date: 11/01/2016  Primary Diagnosis:  Left knee posttraumatic osteoarthritis.  Admission Diagnoses:  Past Medical History:  Diagnosis Date  . Arthritis   . Headache    Discharge Diagnoses:   Principal Problem:   OA (osteoarthritis) of knee  Estimated body mass index is 31.13 kg/m as calculated from the following:   Height as of this encounter: _0  (1.702 m).   Weight as of this encounter: 90.2 kg (198 lb 12 oz).  Procedure:  Procedure(s) (LRB): LEFT TOTAL KNEE ARTHROPLASTY AND HARDWARE REMOVAL (Left) HARDWARE REMOVAL (Left)   Consults: None  HPI: Philip Charles is a 41 year old male, who had a traumatic incident to his left leg at age 41 from a lawnmower.  He sustained soft tissue injury and fracture.  He was treated with distal femoral plating and soft-tissue reconstruction.  He has gone on to develop severe tricompartmental bone-on-bone osteoarthritis.  This has been refractory to nonoperative management including cortisone injections.  It was felt that the only predictable means of improving his pain and function would be a total knee arthroplasty.  He presents today for removal of the femoral plate and for a total knee arthroplasty.  Laboratory Data: Admission on 10/30/2016, Discharged on 11/01/2016  Component Date Value Ref Range Status  . WBC 10/30/2016 10.5  4.0 - 10.5 K/uL Final  . RBC 10/30/2016 4.58  4.22 - 5.81 MIL/uL Final  . Hemoglobin 10/30/2016 13.1  13.0 - 17.0 g/dL Final  . HCT 10/30/2016 38.9* 39.0 - 52.0 % Final  . MCV 10/30/2016 84.9  78.0 - 100.0 fL Final  . MCH 10/30/2016 28.6  26.0 - 34.0 pg Final  . MCHC 10/30/2016 33.7  30.0 - 36.0 g/dL Final  . RDW 10/30/2016 13.3  11.5 - 15.5 % Final  . Platelets 10/30/2016 275  150 - 400 K/uL Final  . Sodium 10/30/2016 135  135 - 145 mmol/L Final  . Potassium 10/30/2016 3.9   3.5 - 5.1 mmol/L Final  . Chloride 10/30/2016 102  101 - 111 mmol/L Final  . CO2 10/30/2016 25  22 - 32 mmol/L Final  . Glucose, Bld 10/30/2016 137* 65 - 99 mg/dL Final  . BUN 10/30/2016 17  6 - 20 mg/dL Final  . Creatinine, Ser 10/30/2016 0.73  0.61 - 1.24 mg/dL Final  . Calcium 10/30/2016 8.8* 8.9 - 10.3 mg/dL Final  . Total Protein 10/30/2016 6.9  6.5 - 8.1 g/dL Final  . Albumin 10/30/2016 3.8  3.5 - 5.0 g/dL Final  . AST 10/30/2016 26  15 - 41 U/L Final  . ALT 10/30/2016 32  17 - 63 U/L Final  . Alkaline Phosphatase 10/30/2016 83  38 - 126 U/L Final  . Total Bilirubin 10/30/2016 0.6  0.3 - 1.2 mg/dL Final  . GFR calc non Af Amer 10/30/2016 >60  >60 mL/min Final  . GFR calc Af Amer 10/30/2016 >60  >60 mL/min Final   Comment: (NOTE) The eGFR has been calculated using the CKD EPI equation. This calculation has not been validated in all clinical situations. eGFR's persistently <60 mL/min signify possible Chronic Kidney Disease.   . Anion gap 10/30/2016 8  5 - 15 Final  . WBC 10/31/2016 9.6  4.0 - 10.5 K/uL Final  . RBC 10/31/2016 4.15* 4.22 - 5.81 MIL/uL Final  . Hemoglobin 10/31/2016 12.2* 13.0 - 17.0 g/dL Final  . HCT 10/31/2016 36.1* 39.0 -  52.0 % Final  . MCV 10/31/2016 87.0  78.0 - 100.0 fL Final  . MCH 10/31/2016 29.4  26.0 - 34.0 pg Final  . MCHC 10/31/2016 33.8  30.0 - 36.0 g/dL Final  . RDW 10/31/2016 13.5  11.5 - 15.5 % Final  . Platelets 10/31/2016 256  150 - 400 K/uL Final  . Sodium 10/31/2016 133* 135 - 145 mmol/L Final  . Potassium 10/31/2016 3.5  3.5 - 5.1 mmol/L Final  . Chloride 10/31/2016 101  101 - 111 mmol/L Final  . CO2 10/31/2016 25  22 - 32 mmol/L Final  . Glucose, Bld 10/31/2016 133* 65 - 99 mg/dL Final  . BUN 10/31/2016 10  6 - 20 mg/dL Final  . Creatinine, Ser 10/31/2016 0.60* 0.61 - 1.24 mg/dL Final  . Calcium 10/31/2016 8.5* 8.9 - 10.3 mg/dL Final  . GFR calc non Af Amer 10/31/2016 >60  >60 mL/min Final  . GFR calc Af Amer 10/31/2016 >60  >60 mL/min  Final   Comment: (NOTE) The eGFR has been calculated using the CKD EPI equation. This calculation has not been validated in all clinical situations. eGFR's persistently <60 mL/min signify possible Chronic Kidney Disease.   . Anion gap 10/31/2016 7  5 - 15 Final  . WBC 11/01/2016 8.8  4.0 - 10.5 K/uL Final  . RBC 11/01/2016 3.98* 4.22 - 5.81 MIL/uL Final  . Hemoglobin 11/01/2016 11.7* 13.0 - 17.0 g/dL Final  . HCT 11/01/2016 35.2* 39.0 - 52.0 % Final  . MCV 11/01/2016 88.4  78.0 - 100.0 fL Final  . MCH 11/01/2016 29.4  26.0 - 34.0 pg Final  . MCHC 11/01/2016 33.2  30.0 - 36.0 g/dL Final  . RDW 11/01/2016 14.1  11.5 - 15.5 % Final  . Platelets 11/01/2016 244  150 - 400 K/uL Final  . Sodium 11/01/2016 135  135 - 145 mmol/L Final  . Potassium 11/01/2016 3.8  3.5 - 5.1 mmol/L Final  . Chloride 11/01/2016 101  101 - 111 mmol/L Final  . CO2 11/01/2016 27  22 - 32 mmol/L Final  . Glucose, Bld 11/01/2016 145* 65 - 99 mg/dL Final  . BUN 11/01/2016 10  6 - 20 mg/dL Final  . Creatinine, Ser 11/01/2016 0.57* 0.61 - 1.24 mg/dL Final  . Calcium 11/01/2016 8.5* 8.9 - 10.3 mg/dL Final  . GFR calc non Af Amer 11/01/2016 >60  >60 mL/min Final  . GFR calc Af Amer 11/01/2016 >60  >60 mL/min Final   Comment: (NOTE) The eGFR has been calculated using the CKD EPI equation. This calculation has not been validated in all clinical situations. eGFR's persistently <60 mL/min signify possible Chronic Kidney Disease.   Georgiann Hahn gap 11/01/2016 7  5 - 15 Final  Hospital Outpatient Visit on 10/23/2016  Component Date Value Ref Range Status  . aPTT 10/23/2016 29  24 - 36 seconds Final  . Prothrombin Time 10/23/2016 13.4  11.4 - 15.2 seconds Final  . INR 10/23/2016 1.02   Final  . ABO/RH(D) 10/23/2016 O POS   Final  . Antibody Screen 10/23/2016 NEG   Final  . Sample Expiration 10/23/2016 11/02/2016   Final  . Extend sample reason 10/23/2016 NO TRANSFUSIONS OR PREGNANCY IN THE PAST 3 MONTHS   Final  . MRSA, PCR  10/23/2016 NEGATIVE  NEGATIVE Final  . Staphylococcus aureus 10/23/2016 NEGATIVE  NEGATIVE Final   Comment:        The Xpert SA Assay (FDA approved for NASAL specimens in patients over 21 years  of age), is one component of a comprehensive surveillance program.  Test performance has been validated by [facility] for patients greater than or equal to 70 year old. It is not intended to diagnose infection nor to guide or monitor treatment.   . ABO/RH(D) 10/23/2016 O POS   Final     X-Rays:No results found.  EKG:No orders found for this or any previous visit.   Hospital Course: Philip Charles is a 41 y.o. who was admitted to [facility]. They were brought to the operating room on 10/30/2016 and underwent Procedure(s): LEFT TOTAL KNEE ARTHROPLASTY AND HARDWARE REMOVAL HARDWARE REMOVAL.  Patient tolerated the procedure well and was later transferred to the recovery room and then to the orthopaedic floor for postoperative care.  They were given PO and IV analgesics for pain control following their surgery.  They were given 24 hours of postoperative antibiotics of  Anti-infectives    Start     Dose/Rate Route Frequency Ordered Stop   10/30/16 1900  ceFAZolin (ANCEF) IVPB 1 g/50 mL premix     1 g 100 mL/hr over 30 Minutes Intravenous Every 6 hours 10/30/16 1753 10/31/16 0205   10/30/16 1115  ceFAZolin (ANCEF) 2-4 GM/100ML-% IVPB    Comments:  Riling, Sonya   : cabinet override      10/30/16 1115 10/30/16 2329   10/30/16 1058  ceFAZolin (ANCEF) IVPB 2g/100 mL premix     2 g 200 mL/hr over 30 Minutes Intravenous On call to O.R. 10/30/16 1058 10/30/16 1242     and started on DVT prophylaxis in the form of Xarelto.   PT and OT were ordered for total joint protocol.  Discharge planning consulted to help with postop disposition and equipment needs.  Patient had a dcent night on the evening of surgery.  They started to get up OOB with therapy on day one. Hemovac drain was pulled without  difficulty.  Continued to work with therapy into day two.  Dressing was changed on day two and the incision was healing well. Patient was seen in rounds and was ready to go home on day two.   Diet: Regular diet Activity:WBAT Follow-up:in 2 weeks Disposition - Home Discharged Condition: good   Discharge Instructions    Call MD / Call 911    Complete by:  As directed    If you experience chest pain or shortness of breath, CALL 911 and be transported to the hospital emergency room.  If you develope a fever above 101 F, pus (white drainage) or increased drainage or redness at the wound, or calf pain, call your surgeon's office.   Constipation Prevention    Complete by:  As directed    Drink plenty of fluids.  Prune juice may be helpful.  You may use a stool softener, such as Colace (over the counter) 100 mg twice a day.  Use MiraLax (over the counter) for constipation as needed.   Diet - low sodium heart healthy    Complete by:  As directed    Discharge instructions    Complete by:  As directed    Dr. Gaynelle Arabian Total Joint Specialist [facility] [address], [place], [place] [address] [phone]  TOTAL KNEE REPLACEMENT POSTOPERATIVE DIRECTIONS  Knee Rehabilitation, Guidelines Following Surgery  Results after knee surgery are often greatly improved when you follow the exercise, range of motion and muscle strengthening exercises prescribed by your doctor. Safety measures are also important to protect the knee from  further injury. Any time any of these exercises cause you to have increased pain or swelling in your knee joint, decrease the amount until you are comfortable again and slowly increase them. If you have problems or questions, call your caregiver or physical therapist for advice.   HOME CARE INSTRUCTIONS  Remove items at home which could result in a fall. This includes throw rugs or furniture in walking pathways.  ICE to the affected knee every  three hours for 30 minutes at a time and then as needed for pain and swelling.  Continue to use ice on the knee for pain and swelling from surgery. You may notice swelling that will progress down to the foot and ankle.  This is normal after surgery.  Elevate the leg when you are not up walking on it.   Continue to use the breathing machine which will help keep your temperature down.  It is common for your temperature to cycle up and down following surgery, especially at night when you are not up moving around and exerting yourself.  The breathing machine keeps your lungs expanded and your temperature down. Do not place pillow under knee, focus on keeping the knee straight while resting  DIET You may resume your previous home diet once your are discharged from the hospital.  DRESSING / WOUND CARE / SHOWERING You may start showering once you are discharged home but do not submerge the incision under water. Just pat the incision dry and apply a dry gauze dressing on daily. Change the surgical dressing daily and reapply a dry dressing each time.  ACTIVITY Walk with your walker as instructed. Use walker as long as suggested by your caregivers. Avoid periods of inactivity such as sitting longer than an hour when not asleep. This helps prevent blood clots.  You may resume a sexual relationship in one month or when given the OK by your doctor.  You may return to work once you are cleared by your doctor.  Do not drive a car for 6 weeks or until released by you surgeon.  Do not drive while taking narcotics.  WEIGHT BEARING Weight bearing as tolerated with assist device (walker, cane, etc) as directed, use it as long as suggested by your surgeon or therapist, typically at least 4-6 weeks.  POSTOPERATIVE CONSTIPATION PROTOCOL Constipation - defined medically as fewer than three stools per week and severe constipation as less than one stool per week.  One of the most common issues patients have  following surgery is constipation.  Even if you have a regular bowel pattern at home, your normal regimen is likely to be disrupted due to multiple reasons following surgery.  Combination of anesthesia, postoperative narcotics, change in appetite and fluid intake all can affect your bowels.  In order to avoid complications following surgery, here are some recommendations in order to help you during your recovery period.  Colace (docusate) - Pick up an over-the-counter form of Colace or another stool softener and take twice a day as long as you are requiring postoperative pain medications.  Take with a full glass of water daily.  If you experience loose stools or diarrhea, hold the colace until you stool forms back up.  If your symptoms do not get better within 1 week or if they get worse, check with your doctor.  Dulcolax (bisacodyl) - Pick up over-the-counter and take as directed by the product packaging as needed to assist with the movement of your bowels.  Take with a full  glass of water.  Use this product as needed if not relieved by Colace only.   MiraLax (polyethylene glycol) - Pick up over-the-counter to have on hand.  MiraLax is a solution that will increase the amount of water in your bowels to assist with bowel movements.  Take as directed and can mix with a glass of water, juice, soda, coffee, or tea.  Take if you go more than two days without a movement. Do not use MiraLax more than once per day. Call your doctor if you are still constipated or irregular after using this medication for 7 days in a row.  If you continue to have problems with postoperative constipation, please contact the office for further assistance and recommendations.  If you experience "the worst abdominal pain ever" or develop nausea or vomiting, please contact the office immediatly for further recommendations for treatment.  ITCHING  If you experience itching with your medications, try taking only a single pain pill, or  even half a pain pill at a time.  You can also use Benadryl over the counter for itching or also to help with sleep.   TED HOSE STOCKINGS Wear the elastic stockings on both legs for three weeks following surgery during the day but you may remove then at night for sleeping.  MEDICATIONS See your medication summary on the "After Visit Summary" that the nursing staff will review with you prior to discharge.  You may have some home medications which will be placed on hold until you complete the course of blood thinner medication.  It is important for you to complete the blood thinner medication as prescribed by your surgeon.  Continue your approved medications as instructed at time of discharge.  PRECAUTIONS If you experience chest pain or shortness of breath - call 911 immediately for transfer to the hospital emergency department.  If you develop a fever greater that 101 F, purulent drainage from wound, increased redness or drainage from wound, foul odor from the wound/dressing, or calf pain - CONTACT YOUR SURGEON.                                                   FOLLOW-UP APPOINTMENTS Make sure you keep all of your appointments after your operation with your surgeon and caregivers. You should call the office at the above phone number and make an appointment for approximately two weeks after the date of your surgery or on the date instructed by your surgeon outlined in the "After Visit Summary".   RANGE OF MOTION AND STRENGTHENING EXERCISES  Rehabilitation of the knee is important following a knee injury or an operation. After just a few days of immobilization, the muscles of the thigh which control the knee become weakened and shrink (atrophy). Knee exercises are designed to build up the tone and strength of the thigh muscles and to improve knee motion. Often times heat used for twenty to thirty minutes before working out will loosen up your tissues and help with improving the range of motion but do  not use heat for the first two weeks following surgery. These exercises can be done on a training (exercise) mat, on the floor, on a table or on a bed. Use what ever works the best and is most comfortable for you Knee exercises include:  Leg Lifts - While your knee is still immobilized  in a splint or cast, you can do straight leg raises. Lift the leg to 60 degrees, hold for 3 sec, and slowly lower the leg. Repeat 10-20 times 2-3 times daily. Perform this exercise against resistance later as your knee gets better.  Quad and Hamstring Sets - Tighten up the muscle on the front of the thigh (Quad) and hold for 5-10 sec. Repeat this 10-20 times hourly. Hamstring sets are done by pushing the foot backward against an object and holding for 5-10 sec. Repeat as with quad sets.  Leg Slides: Lying on your back, slowly slide your foot toward your buttocks, bending your knee up off the floor (only go as far as is comfortable). Then slowly slide your foot back down until your leg is flat on the floor again. Angel Wings: Lying on your back spread your legs to the side as far apart as you can without causing discomfort.  A rehabilitation program following serious knee injuries can speed recovery and prevent re-injury in the future due to weakened muscles. Contact your doctor or a physical therapist for more information on knee rehabilitation.   IF YOU ARE TRANSFERRED TO A SKILLED REHAB FACILITY If the patient is transferred to a skilled rehab facility following release from the hospital, a list of the current medications will be sent to the facility for the patient to continue.  When discharged from the skilled rehab facility, please have the facility set up the patient's [place] prior to being released. Also, the skilled facility will be responsible for providing the patient with their medications at time of release from the facility to include their pain medication, the muscle relaxants, and their  blood thinner medication. If the patient is still at the rehab facility at time of the two week follow up appointment, the skilled rehab facility will also need to assist the patient in arranging follow up appointment in our office and any transportation needs.  MAKE SURE YOU:  Understand these instructions.  Get help right away if you are not doing well or get worse.    Pick up stool softner and laxative for home use following surgery while on pain medications. Do not submerge incision under water. Please use good hand washing techniques while changing dressing each day. May shower starting three days after surgery. Please use a clean towel to pat the incision dry following showers. Continue to use ice for pain and swelling after surgery. Do not use any lotions or creams on the incision until instructed by your surgeon.  INSTRUCTIONS AFTER JOINT REPLACEMENT   Remove items at home which could result in a fall. This includes throw rugs or furniture in walking pathways ICE to the affected joint every three hours while awake for 30 minutes at a time, for at least the first 3-5 days, and then as needed for pain and swelling.  Continue to use ice for pain and swelling. You may notice swelling that will progress down to the foot and ankle.  This is normal after surgery.  Elevate your leg when you are not up walking on it.   Continue to use the breathing machine you got in the hospital (incentive spirometer) which will help keep your temperature down.  It is common for your temperature to cycle up and down following surgery, especially at night when you are not up moving around and exerting yourself.  The breathing machine keeps your lungs expanded and your temperature down.   DIET:  As you were  doing prior to hospitalization, we recommend a well-balanced diet.  DRESSING / WOUND CARE / SHOWERING  You may change your dressing every day with sterile gauze.  Please use good hand washing techniques  before changing the dressing.  Do not use any lotions or creams on the incision until instructed by your surgeon.  ACTIVITY  Increase activity slowly as tolerated, but follow the weight bearing instructions below.   No driving for 6 weeks or until further direction given by your physician.  You cannot drive while taking narcotics.  No lifting or carrying greater than 10 lbs. until further directed by your surgeon. Avoid periods of inactivity such as sitting longer than an hour when not asleep. This helps prevent blood clots.  You may return to work once you are authorized by your doctor.     WEIGHT BEARING   Weight bearing as tolerated with assist device (walker, cane, etc) as directed, use it as long as suggested by your surgeon or therapist, typically at least 4-6 weeks.   EXERCISES  Results after joint replacement surgery are often greatly improved when you follow the exercise, range of motion and muscle strengthening exercises prescribed by your doctor. Safety measures are also important to protect the joint from further injury. Any time any of these exercises cause you to have increased pain or swelling, decrease what you are doing until you are comfortable again and then slowly increase them. If you have problems or questions, call your caregiver or physical therapist for advice.   Rehabilitation is important following a joint replacement. After just a few days of immobilization, the muscles of the leg can become weakened and shrink (atrophy).  These exercises are designed to build up the tone and strength of the thigh and leg muscles and to improve motion. Often times heat used for twenty to thirty minutes before working out will loosen up your tissues and help with improving the range of motion but do not use heat for the first two weeks following surgery (sometimes heat can increase post-operative swelling).   These exercises can be done on a training (exercise) mat, on the floor, on  a table or on a bed. Use whatever works the best and is most comfortable for you.    Use music or television while you are exercising so that the exercises are a pleasant break in your day. This will make your life better with the exercises acting as a break in your routine that you can look forward to.   Perform all exercises about fifteen times, three times per day or as directed.  You should exercise both the operative leg and the other leg as well.  Exercises include:   Quad Sets - Tighten up the muscle on the front of the thigh (Quad) and hold for 5-10 seconds.   Straight Leg Raises - With your knee straight (if you were given a brace, keep it on), lift the leg to 60 degrees, hold for 3 seconds, and slowly lower the leg.  Perform this exercise against resistance later as your leg gets stronger.  Leg Slides: Lying on your back, slowly slide your foot toward your buttocks, bending your knee up off the floor (only go as far as is comfortable). Then slowly slide your foot back down until your leg is flat on the floor again.  Angel Wings: Lying on your back spread your legs to the side as far apart as you can without causing discomfort.  Hamstring Strength:  Lying on  your back, push your heel against the floor with your leg straight by tightening up the muscles of your buttocks.  Repeat, but this time bend your knee to a comfortable angle, and push your heel against the floor.  You may put a pillow under the heel to make it more comfortable if necessary.   A rehabilitation program following joint replacement surgery can speed recovery and prevent re-injury in the future due to weakened muscles. Contact your doctor or a physical therapist for more information on knee rehabilitation.    CONSTIPATION  Constipation is defined medically as fewer than three stools per week and severe constipation as less than one stool per week.  Even if you have a regular bowel pattern at home, your normal regimen is  likely to be disrupted due to multiple reasons following surgery.  Combination of anesthesia, postoperative narcotics, change in appetite and fluid intake all can affect your bowels.   YOU MUST use at least one of the following options; they are listed in order of increasing strength to get the job done.  They are all available over the counter, and you may need to use some, POSSIBLY even all of these options:    Drink plenty of fluids (prune juice may be helpful) and high fiber foods Colace 100 mg by mouth twice a day  Senokot for constipation as directed and as needed Dulcolax (bisacodyl), take with full glass of water  Miralax (polyethylene glycol) once or twice a day as needed.  If you have tried all these things and are unable to have a bowel movement in the first 3-4 days after surgery call either your surgeon or your primary doctor.    If you experience loose stools or diarrhea, hold the medications until you stool forms back up.  If your symptoms do not get better within 1 week or if they get worse, check with your doctor.  If you experience "the worst abdominal pain ever" or develop nausea or vomiting, please contact the office immediately for further recommendations for treatment.   ITCHING:  If you experience itching with your medications, try taking only a single pain pill, or even half a pain pill at a time.  You can also use Benadryl over the counter for itching or also to help with sleep.   TED HOSE STOCKINGS:  Use stockings on both legs until for at least 2 weeks or as directed by physician office. They may be removed at night for sleeping.  MEDICATIONS:  See your medication summary on the "After Visit Summary" that nursing will review with you.  You may have some home medications which will be placed on hold until you complete the course of blood thinner medication.  It is important for you to complete the blood thinner medication as prescribed.  PRECAUTIONS:  If you experience  chest pain or shortness of breath - call 911 immediately for transfer to the hospital emergency department.   If you develop a fever greater that 101 F, purulent drainage from wound, increased redness or drainage from wound, foul odor from the wound/dressing, or calf pain - CONTACT YOUR SURGEON.                                                   FOLLOW-UP APPOINTMENTS:  If you do not already have a post-op appointment, please  call the office for an appointment to be seen by your surgeon.  Guidelines for how soon to be seen are listed in your "After Visit Summary", but are typically between 1-4 weeks after surgery.  OTHER INSTRUCTIONS:   Knee Replacement:  Do not place pillow under knee, focus on keeping the knee straight while resting. CPM instructions: 0-90 degrees, 2 hours in the morning, 2 hours in the afternoon, and 2 hours in the evening. Place foam block, curve side up under heel at all times except when in CPM or when walking.  DO NOT modify, tear, cut, or change the foam block in any way.  MAKE SURE YOU:  Understand these instructions.  Get help right away if you are not doing well or get worse.    Thank you for letting us be a part of your medical care team.  It is a privilege we respect greatly.  We hope these instructions will help you stay on track for a fast and full recovery!   Increase activity slowly as tolerated    Complete by:  As directed      Allergies as of 11/01/2016   No Known Allergies     Medication List    STOP taking these medications   multivitamin with minerals tablet     TAKE these medications   acetaminophen 325 MG tablet Commonly known as:  TYLENOL Take 650 mg by mouth every 6 (six) hours as needed for moderate pain.   fexofenadine 180 MG tablet Commonly known as:  ALLEGRA Take 180 mg by mouth daily.   gabapentin 300 MG capsule Commonly known as:  NEURONTIN Take 1 capsule (300 mg total) by mouth 2 (two) times daily. For two weeks then 1 tablets  daily for one week   methocarbamol 500 MG tablet Commonly known as:  ROBAXIN Take 1 tablet (500 mg total) by mouth every 6 (six) hours as needed for muscle spasms.   oxyCODONE 5 MG immediate release tablet Commonly known as:  Oxy IR/ROXICODONE Take 1-2 tablets (5-10 mg total) by mouth every 3 (three) hours as needed for breakthrough pain.   rivaroxaban 10 MG Tabs tablet Commonly known as:  XARELTO Take 1 tablet (10 mg total) by mouth daily with breakfast.   traMADol 50 MG tablet Commonly known as:  ULTRAM Take 1-2 tablets (50-100 mg total) by mouth every 6 (six) hours as needed for moderate pain.      Follow-up Information    Gearlean Alf, MD. Schedule an appointment as soon as possible for a visit on 11/14/2016.   Specialty:  Orthopedic Surgery Why:  Call [phone] tomorrow to make / confirm the appointment for 4/17 Contact information: [address] [place] [address] [place] [facility] up.   Specialty:  [facility] Contact information: [address] [place] [address] [phone]           Signed: Arlee Muslim, PA-C Orthopaedic Surgery 11/16/2016, 8:28 AM

## 2016-11-17 DIAGNOSIS — Z96652 Presence of left artificial knee joint: Secondary | ICD-10-CM | POA: Diagnosis not present

## 2016-11-17 DIAGNOSIS — Z7901 Long term (current) use of anticoagulants: Secondary | ICD-10-CM | POA: Diagnosis not present

## 2016-11-17 DIAGNOSIS — Z471 Aftercare following joint replacement surgery: Secondary | ICD-10-CM | POA: Diagnosis not present

## 2016-11-17 DIAGNOSIS — Z79891 Long term (current) use of opiate analgesic: Secondary | ICD-10-CM | POA: Diagnosis not present

## 2016-12-07 DIAGNOSIS — Z96652 Presence of left artificial knee joint: Secondary | ICD-10-CM | POA: Diagnosis not present

## 2016-12-07 DIAGNOSIS — Z471 Aftercare following joint replacement surgery: Secondary | ICD-10-CM | POA: Diagnosis not present

## 2016-12-11 DIAGNOSIS — M25662 Stiffness of left knee, not elsewhere classified: Secondary | ICD-10-CM | POA: Diagnosis not present

## 2016-12-13 DIAGNOSIS — M25662 Stiffness of left knee, not elsewhere classified: Secondary | ICD-10-CM | POA: Diagnosis not present

## 2016-12-18 DIAGNOSIS — M25662 Stiffness of left knee, not elsewhere classified: Secondary | ICD-10-CM | POA: Diagnosis not present

## 2016-12-20 DIAGNOSIS — M25662 Stiffness of left knee, not elsewhere classified: Secondary | ICD-10-CM | POA: Diagnosis not present

## 2016-12-26 DIAGNOSIS — M25662 Stiffness of left knee, not elsewhere classified: Secondary | ICD-10-CM | POA: Diagnosis not present

## 2016-12-28 DIAGNOSIS — M25662 Stiffness of left knee, not elsewhere classified: Secondary | ICD-10-CM | POA: Diagnosis not present

## 2017-01-01 DIAGNOSIS — M25662 Stiffness of left knee, not elsewhere classified: Secondary | ICD-10-CM | POA: Diagnosis not present

## 2017-03-21 NOTE — Addendum Note (Signed)
Addendum  created 03/21/17 1355 by Torres Hardenbrook, MD   Sign clinical note    

## 2017-05-09 DIAGNOSIS — M1732 Unilateral post-traumatic osteoarthritis, left knee: Secondary | ICD-10-CM | POA: Diagnosis not present

## 2017-10-11 DIAGNOSIS — E559 Vitamin D deficiency, unspecified: Secondary | ICD-10-CM | POA: Diagnosis not present

## 2017-10-11 DIAGNOSIS — Z0000 Encounter for general adult medical examination without abnormal findings: Secondary | ICD-10-CM | POA: Diagnosis not present

## 2017-10-11 DIAGNOSIS — R7989 Other specified abnormal findings of blood chemistry: Secondary | ICD-10-CM | POA: Diagnosis not present

## 2017-10-11 DIAGNOSIS — I10 Essential (primary) hypertension: Secondary | ICD-10-CM | POA: Diagnosis not present

## 2017-10-18 DIAGNOSIS — R7989 Other specified abnormal findings of blood chemistry: Secondary | ICD-10-CM | POA: Diagnosis not present

## 2017-10-18 DIAGNOSIS — Z0000 Encounter for general adult medical examination without abnormal findings: Secondary | ICD-10-CM | POA: Diagnosis not present

## 2017-10-18 DIAGNOSIS — E785 Hyperlipidemia, unspecified: Secondary | ICD-10-CM | POA: Diagnosis not present

## 2019-04-23 DIAGNOSIS — I10 Essential (primary) hypertension: Secondary | ICD-10-CM | POA: Diagnosis not present

## 2019-04-23 DIAGNOSIS — Z125 Encounter for screening for malignant neoplasm of prostate: Secondary | ICD-10-CM | POA: Diagnosis not present

## 2019-04-30 DIAGNOSIS — E785 Hyperlipidemia, unspecified: Secondary | ICD-10-CM | POA: Diagnosis not present

## 2019-04-30 DIAGNOSIS — Z23 Encounter for immunization: Secondary | ICD-10-CM | POA: Diagnosis not present

## 2019-04-30 DIAGNOSIS — Z0000 Encounter for general adult medical examination without abnormal findings: Secondary | ICD-10-CM | POA: Diagnosis not present

## 2020-04-26 DIAGNOSIS — R7989 Other specified abnormal findings of blood chemistry: Secondary | ICD-10-CM | POA: Diagnosis not present

## 2020-04-26 DIAGNOSIS — I10 Essential (primary) hypertension: Secondary | ICD-10-CM | POA: Diagnosis not present

## 2020-04-26 DIAGNOSIS — Z0000 Encounter for general adult medical examination without abnormal findings: Secondary | ICD-10-CM | POA: Diagnosis not present

## 2020-04-26 DIAGNOSIS — Z125 Encounter for screening for malignant neoplasm of prostate: Secondary | ICD-10-CM | POA: Diagnosis not present

## 2020-04-26 DIAGNOSIS — E785 Hyperlipidemia, unspecified: Secondary | ICD-10-CM | POA: Diagnosis not present

## 2020-05-03 DIAGNOSIS — Z0000 Encounter for general adult medical examination without abnormal findings: Secondary | ICD-10-CM | POA: Diagnosis not present

## 2020-05-03 DIAGNOSIS — I10 Essential (primary) hypertension: Secondary | ICD-10-CM | POA: Diagnosis not present

## 2020-05-03 DIAGNOSIS — Z23 Encounter for immunization: Secondary | ICD-10-CM | POA: Diagnosis not present

## 2021-04-27 DIAGNOSIS — R7989 Other specified abnormal findings of blood chemistry: Secondary | ICD-10-CM | POA: Diagnosis not present

## 2021-04-27 DIAGNOSIS — I10 Essential (primary) hypertension: Secondary | ICD-10-CM | POA: Diagnosis not present

## 2021-04-27 DIAGNOSIS — E559 Vitamin D deficiency, unspecified: Secondary | ICD-10-CM | POA: Diagnosis not present

## 2021-04-27 DIAGNOSIS — E785 Hyperlipidemia, unspecified: Secondary | ICD-10-CM | POA: Diagnosis not present

## 2021-04-27 DIAGNOSIS — Z125 Encounter for screening for malignant neoplasm of prostate: Secondary | ICD-10-CM | POA: Diagnosis not present

## 2021-05-04 DIAGNOSIS — H6120 Impacted cerumen, unspecified ear: Secondary | ICD-10-CM | POA: Diagnosis not present

## 2021-05-04 DIAGNOSIS — E785 Hyperlipidemia, unspecified: Secondary | ICD-10-CM | POA: Diagnosis not present

## 2021-05-04 DIAGNOSIS — Z0000 Encounter for general adult medical examination without abnormal findings: Secondary | ICD-10-CM | POA: Diagnosis not present

## 2021-05-04 DIAGNOSIS — Z23 Encounter for immunization: Secondary | ICD-10-CM | POA: Diagnosis not present

## 2021-05-04 DIAGNOSIS — I10 Essential (primary) hypertension: Secondary | ICD-10-CM | POA: Diagnosis not present

## 2021-05-04 DIAGNOSIS — R7303 Prediabetes: Secondary | ICD-10-CM | POA: Diagnosis not present

## 2022-05-01 DIAGNOSIS — Z125 Encounter for screening for malignant neoplasm of prostate: Secondary | ICD-10-CM | POA: Diagnosis not present

## 2022-05-01 DIAGNOSIS — E785 Hyperlipidemia, unspecified: Secondary | ICD-10-CM | POA: Diagnosis not present

## 2022-05-01 DIAGNOSIS — R7303 Prediabetes: Secondary | ICD-10-CM | POA: Diagnosis not present

## 2022-05-01 DIAGNOSIS — E559 Vitamin D deficiency, unspecified: Secondary | ICD-10-CM | POA: Diagnosis not present

## 2022-05-08 DIAGNOSIS — I10 Essential (primary) hypertension: Secondary | ICD-10-CM | POA: Diagnosis not present

## 2022-05-08 DIAGNOSIS — R7303 Prediabetes: Secondary | ICD-10-CM | POA: Diagnosis not present

## 2022-05-08 DIAGNOSIS — Z23 Encounter for immunization: Secondary | ICD-10-CM | POA: Diagnosis not present

## 2022-05-08 DIAGNOSIS — E785 Hyperlipidemia, unspecified: Secondary | ICD-10-CM | POA: Diagnosis not present

## 2022-05-08 DIAGNOSIS — Z0000 Encounter for general adult medical examination without abnormal findings: Secondary | ICD-10-CM | POA: Diagnosis not present

## 2022-05-23 DIAGNOSIS — I10 Essential (primary) hypertension: Secondary | ICD-10-CM | POA: Diagnosis not present

## 2023-05-14 DIAGNOSIS — R7303 Prediabetes: Secondary | ICD-10-CM | POA: Diagnosis not present

## 2023-05-14 DIAGNOSIS — G4733 Obstructive sleep apnea (adult) (pediatric): Secondary | ICD-10-CM | POA: Diagnosis not present

## 2023-05-14 DIAGNOSIS — E559 Vitamin D deficiency, unspecified: Secondary | ICD-10-CM | POA: Diagnosis not present

## 2023-05-14 DIAGNOSIS — E785 Hyperlipidemia, unspecified: Secondary | ICD-10-CM | POA: Diagnosis not present

## 2023-05-14 DIAGNOSIS — Z125 Encounter for screening for malignant neoplasm of prostate: Secondary | ICD-10-CM | POA: Diagnosis not present

## 2023-05-14 DIAGNOSIS — I10 Essential (primary) hypertension: Secondary | ICD-10-CM | POA: Diagnosis not present

## 2023-05-21 DIAGNOSIS — Z0000 Encounter for general adult medical examination without abnormal findings: Secondary | ICD-10-CM | POA: Diagnosis not present

## 2023-05-21 DIAGNOSIS — R7303 Prediabetes: Secondary | ICD-10-CM | POA: Diagnosis not present

## 2023-05-21 DIAGNOSIS — G4733 Obstructive sleep apnea (adult) (pediatric): Secondary | ICD-10-CM | POA: Diagnosis not present

## 2023-05-21 DIAGNOSIS — E785 Hyperlipidemia, unspecified: Secondary | ICD-10-CM | POA: Diagnosis not present

## 2023-05-21 DIAGNOSIS — Z23 Encounter for immunization: Secondary | ICD-10-CM | POA: Diagnosis not present

## 2024-05-19 DIAGNOSIS — R7303 Prediabetes: Secondary | ICD-10-CM | POA: Diagnosis not present

## 2024-05-19 DIAGNOSIS — E785 Hyperlipidemia, unspecified: Secondary | ICD-10-CM | POA: Diagnosis not present

## 2024-05-19 DIAGNOSIS — E559 Vitamin D deficiency, unspecified: Secondary | ICD-10-CM | POA: Diagnosis not present

## 2024-05-19 DIAGNOSIS — Z0000 Encounter for general adult medical examination without abnormal findings: Secondary | ICD-10-CM | POA: Diagnosis not present

## 2024-05-26 DIAGNOSIS — Z0000 Encounter for general adult medical examination without abnormal findings: Secondary | ICD-10-CM | POA: Diagnosis not present

## 2024-05-26 DIAGNOSIS — Z23 Encounter for immunization: Secondary | ICD-10-CM | POA: Diagnosis not present

## 2024-05-26 DIAGNOSIS — R7303 Prediabetes: Secondary | ICD-10-CM | POA: Diagnosis not present

## 2024-05-26 DIAGNOSIS — E785 Hyperlipidemia, unspecified: Secondary | ICD-10-CM | POA: Diagnosis not present

## 2024-05-26 DIAGNOSIS — G4733 Obstructive sleep apnea (adult) (pediatric): Secondary | ICD-10-CM | POA: Diagnosis not present
